# Patient Record
Sex: Female | Born: 1995
Health system: Southern US, Community
[De-identification: ages and names within clinical notes are randomized; demographics above are authoritative.]

## PROBLEM LIST (undated history)

## (undated) DIAGNOSIS — E119 Type 2 diabetes mellitus without complications: Secondary | ICD-10-CM

## (undated) DIAGNOSIS — R011 Cardiac murmur, unspecified: Secondary | ICD-10-CM

## (undated) DIAGNOSIS — F419 Anxiety disorder, unspecified: Secondary | ICD-10-CM

## (undated) HISTORY — DX: Anxiety disorder, unspecified: F41.9

## (undated) HISTORY — DX: Type 2 diabetes mellitus without complications: E11.9

---
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
## 2017-04-14 ENCOUNTER — Ambulatory Visit (INDEPENDENT_AMBULATORY_CARE_PROVIDER_SITE_OTHER): Payer: BLUE CROSS/BLUE SHIELD | Admitting: Family Medicine

## 2017-04-14 ENCOUNTER — Encounter: Payer: Self-pay | Admitting: Family Medicine

## 2017-04-14 VITALS — BP 120/70 | HR 80 | Ht 72.0 in | Wt 241.0 lb

## 2017-04-14 DIAGNOSIS — R55 Syncope and collapse: Secondary | ICD-10-CM | POA: Diagnosis not present

## 2017-04-14 DIAGNOSIS — E069 Thyroiditis, unspecified: Secondary | ICD-10-CM | POA: Insufficient documentation

## 2017-04-14 DIAGNOSIS — E063 Autoimmune thyroiditis: Secondary | ICD-10-CM | POA: Diagnosis not present

## 2017-04-14 DIAGNOSIS — R000 Tachycardia, unspecified: Secondary | ICD-10-CM | POA: Insufficient documentation

## 2017-04-14 DIAGNOSIS — J40 Bronchitis, not specified as acute or chronic: Secondary | ICD-10-CM | POA: Insufficient documentation

## 2017-04-14 LAB — POCT CBG (FASTING - GLUCOSE)-MANUAL ENTRY: Glucose Fasting, POC: 116 mg/dL — AB (ref 70–99)

## 2017-04-14 MED ORDER — AMOXICILLIN-POT CLAVULANATE 875-125 MG PO TABS: 1.0000 | ORAL_TABLET | Freq: Two times a day (BID) | ORAL | 0 refills | Status: DC

## 2017-04-14 NOTE — Progress Notes (Signed)
Name: Ruth Garner   MRN: [id number]    DOB: January 17, 1996   Date:04/14/2017       Progress Note  Subjective  Chief Complaint  Chief Complaint  Patient presents with  . Near Syncope    Near Syncope  This is a new (began october) problem. The current episode started more than 1 month ago (last episode). The problem occurs daily. The problem has been waxing and waning. Associated symptoms include congestion, coughing and nausea. Pertinent negatives include no abdominal pain, anorexia, arthralgias, change in bowel habit, chest pain, chills, diaphoresis, fatigue, fever, headaches, joint swelling, myalgias, neck pain, numbness, rash, sore throat, swollen glands, urinary symptoms, vertigo, visual change, vomiting or weakness. Associated symptoms comments: palpatation "gets really fast". Nothing aggravates the symptoms. She has tried nothing for the symptoms. The treatment provided moderate relief.  Cough  This is a new problem. The current episode started in the past 7 days. The problem has been gradually worsening. The cough is productive of purulent sputum (yellow/green). Pertinent negatives include no chest pain, chills, ear congestion, ear pain, fever, headaches, heartburn, hemoptysis, myalgias, nasal congestion, postnasal drip, rash, rhinorrhea, sore throat, shortness of breath, sweats, weight loss or wheezing. The symptoms are aggravated by pollens. Her past medical history is significant for bronchitis. There is no history of environmental allergies.  Thyroid Problem  Presents for follow-up visit. Symptoms include cold intolerance, hoarse voice, palpitations and weight gain. Patient reports no anxiety, constipation, depressed mood, diaphoresis, diarrhea, fatigue, hair loss, heat intolerance, leg swelling, menstrual problem, nail problem, tremors, visual change or weight loss.    No problem-specific Assessment & Plan notes found for this encounter.   Past Medical History:  Diagnosis Date  .  Anxiety     No past surgical history on file.  Family History  Problem Relation Age of Onset  . Diabetes Mother   . Hypertension Father     Social History   Social History  . Marital status: Single    Spouse name: N/A  . Number of children: N/A  . Years of education: N/A   Occupational History  . Not on file.   Social History Main Topics  . Smoking status: Current Every Day Smoker    Packs/day: 0.50    Types: Cigarettes  . Smokeless tobacco: Never Used  . Alcohol use No  . Drug use: No  . Sexual activity: No   Other Topics Concern  . Not on file   Social History Narrative  . No narrative on file    No Known Allergies  Outpatient Medications Prior to Visit  Medication Sig Dispense Refill  . metoprolol succinate (TOPROL-XL) 25 MG 24 hr tablet Take 1 tablet (25 mg total) by mouth daily. 30 tablet 1  . amoxicillin-clavulanate (AUGMENTIN) 875-125 MG tablet Take 1 tablet by mouth 2 (two) times daily. 20 tablet 0  . busPIRone (BUSPAR) 7.5 MG tablet Take 1 tablet (7.5 mg total) by mouth 2 (two) times daily. 60 tablet 2  . HYDROcodone-acetaminophen (NORCO) 5-325 MG tablet Take 1 tablet by mouth every 6 (six) hours as needed for moderate pain. 10 tablet 0  . ondansetron (ZOFRAN) 4 MG tablet Take 1 tablet (4 mg total) by mouth every 8 (eight) hours as needed for nausea or vomiting. 21 tablet 0  . triamcinolone cream (KENALOG) 0.1 % Apply 1 application topically 2 (two) times daily. 30 g 2   No facility-administered medications prior to visit.     Review of Systems  Constitutional: Positive  for weight gain. Negative for chills, diaphoresis, fatigue, fever, malaise/fatigue and weight loss.  HENT: Positive for congestion and hoarse voice. Negative for ear discharge, ear pain, postnasal drip, rhinorrhea and sore throat.   Eyes: Negative for blurred vision.  Respiratory: Positive for cough. Negative for hemoptysis, sputum production, shortness of breath and wheezing.    Cardiovascular: Positive for palpitations and near-syncope. Negative for chest pain and leg swelling.  Gastrointestinal: Positive for nausea. Negative for abdominal pain, anorexia, blood in stool, change in bowel habit, constipation, diarrhea, heartburn, melena and vomiting.  Genitourinary: Negative for dysuria, frequency, hematuria, menstrual problem and urgency.  Musculoskeletal: Negative for arthralgias, back pain, joint pain, joint swelling, myalgias and neck pain.  Skin: Negative for rash.  Neurological: Negative for dizziness, vertigo, tingling, tremors, sensory change, focal weakness, weakness, numbness and headaches.  Endo/Heme/Allergies: Positive for cold intolerance. Negative for environmental allergies, heat intolerance and polydipsia. Does not bruise/bleed easily.  Psychiatric/Behavioral: Negative for depression and suicidal ideas. The patient is not nervous/anxious and does not have insomnia.      Objective  Vitals:   04/14/17 1336  BP: 120/70  Pulse: 80  Weight: 241 lb (109.3 kg)  Height: 6' (1.829 m)    Physical Exam  Constitutional: She is well-developed, well-nourished, and in no distress. No distress.  HENT:  Head: Normocephalic and atraumatic.  Right Ear: External ear normal.  Left Ear: External ear normal.  Nose: Nose normal.  Mouth/Throat: Oropharynx is clear and moist.  Eyes: Conjunctivae and EOM are normal. Pupils are equal, round, and reactive to light. Right eye exhibits no discharge. Left eye exhibits no discharge.  Neck: Normal range of motion. Neck supple. No JVD present. No thyromegaly present.  Cardiovascular: Normal rate, regular rhythm, normal heart sounds and intact distal pulses.  Exam reveals no gallop and no friction rub.   No murmur heard. Pulmonary/Chest: Effort normal and breath sounds normal. She has no wheezes. She has no rales.  Abdominal: Soft. Bowel sounds are normal. She exhibits no mass. There is no tenderness. There is no guarding.   Musculoskeletal: Normal range of motion. She exhibits no edema.  Lymphadenopathy:    She has no cervical adenopathy.  Neurological: She is alert. She has normal reflexes.  Skin: Skin is warm and dry. She is not diaphoretic.  Psychiatric: Mood and affect normal.  Nursing note and vitals reviewed.     Assessment & Plan  Problem List Items Addressed This Visit      Cardiovascular and Mediastinum   Near syncope - Primary   Relevant Orders   EKG 12-Lead (Completed)   Basic metabolic panel   CBC with Differential/Platelet   Thyroid Panel With TSH   Ambulatory referral to Cardiology   POCT CBG (Fasting - Glucose) (Completed)     Respiratory   Bronchitis   Relevant Medications   amoxicillin-clavulanate (AUGMENTIN) 875-125 MG tablet     Endocrine   Autoimmune lymphocytic chronic thyroiditis   Relevant Orders   Thyroid Panel With TSH   POCT CBG (Fasting - Glucose) (Completed)     Other   Tachycardia   Relevant Orders   Thyroid Panel With TSH   Ambulatory referral to Cardiology      Meds ordered this encounter  Medications  . amoxicillin-clavulanate (AUGMENTIN) 875-125 MG tablet    Sig: Take 1 tablet by mouth 2 (two) times daily.    Dispense:  20 tablet    Refill:  0      Dr. Elizabeth Sauer [facility]  [facility]  04/14/17

## 2017-04-15 LAB — BASIC METABOLIC PANEL
BUN/Creatinine Ratio: 17 (ref 9–23)
BUN: 9 mg/dL (ref 6–20)
CALCIUM: 9.5 mg/dL (ref 8.7–10.2)
CO2: 24 mmol/L (ref 18–29)
CREATININE: 0.52 mg/dL — AB (ref 0.57–1.00)
Chloride: 98 mmol/L (ref 96–106)
GFR calc Af Amer: 160 mL/min/{1.73_m2} (ref >59)
GFR calc non Af Amer: 139 mL/min/{1.73_m2} (ref >59)
GLUCOSE: 84 mg/dL (ref 65–99)
Potassium: 4.8 mmol/L (ref 3.5–5.2)
SODIUM: 139 mmol/L (ref 134–144)

## 2017-04-15 LAB — CBC WITH DIFFERENTIAL/PLATELET
Basophils Absolute: 0 10*3/uL (ref 0.0–0.2)
Basos: 0 %
EOS (ABSOLUTE): 0.2 10*3/uL (ref 0.0–0.4)
EOS: 2 %
HEMATOCRIT: 41.9 % (ref 34.0–46.6)
Hemoglobin: 14.1 g/dL (ref 11.1–15.9)
IMMATURE GRANULOCYTES: 0 %
Immature Grans (Abs): 0 10*3/uL (ref 0.0–0.1)
Lymphocytes Absolute: 3.1 10*3/uL (ref 0.7–3.1)
Lymphs: 25 %
MCH: 29.3 pg (ref 26.6–33.0)
MCHC: 33.7 g/dL (ref 31.5–35.7)
MCV: 87 fL (ref 79–97)
MONOS ABS: 0.6 10*3/uL (ref 0.1–0.9)
Monocytes: 5 %
NEUTROS PCT: 68 %
Neutrophils Absolute: 8.3 10*3/uL — ABNORMAL HIGH (ref 1.4–7.0)
PLATELETS: 330 10*3/uL (ref 150–379)
RBC: 4.82 x10E6/uL (ref 3.77–5.28)
RDW: 13.2 % (ref 12.3–15.4)
WBC: 12.2 10*3/uL — AB (ref 3.4–10.8)

## 2017-04-15 LAB — THYROID PANEL WITH TSH
Free Thyroxine Index: 2 (ref 1.2–4.9)
T3 UPTAKE RATIO: 26 % (ref 24–39)
T4 TOTAL: 7.7 ug/dL (ref 4.5–12.0)
TSH: 2.33 u[IU]/mL (ref 0.450–4.500)

## 2017-04-16 DIAGNOSIS — R000 Tachycardia, unspecified: Secondary | ICD-10-CM | POA: Diagnosis not present

## 2017-04-16 DIAGNOSIS — R55 Syncope and collapse: Secondary | ICD-10-CM | POA: Diagnosis not present

## 2017-04-16 DIAGNOSIS — R011 Cardiac murmur, unspecified: Secondary | ICD-10-CM | POA: Diagnosis not present

## 2017-04-16 DIAGNOSIS — I208 Other forms of angina pectoris: Secondary | ICD-10-CM | POA: Diagnosis not present

## 2017-04-25 DIAGNOSIS — R000 Tachycardia, unspecified: Secondary | ICD-10-CM | POA: Diagnosis not present

## 2017-04-25 DIAGNOSIS — R55 Syncope and collapse: Secondary | ICD-10-CM | POA: Diagnosis not present

## 2017-04-28 DIAGNOSIS — R0602 Shortness of breath: Secondary | ICD-10-CM | POA: Diagnosis not present

## 2017-04-28 DIAGNOSIS — R011 Cardiac murmur, unspecified: Secondary | ICD-10-CM | POA: Diagnosis not present

## 2017-04-28 DIAGNOSIS — I208 Other forms of angina pectoris: Secondary | ICD-10-CM | POA: Diagnosis not present

## 2017-04-28 DIAGNOSIS — R000 Tachycardia, unspecified: Secondary | ICD-10-CM | POA: Diagnosis not present

## 2017-04-28 DIAGNOSIS — R55 Syncope and collapse: Secondary | ICD-10-CM | POA: Diagnosis not present

## 2017-07-04 ENCOUNTER — Emergency Department
Admission: EM | Admit: 2017-07-04 | Discharge: 2017-07-04 | Disposition: A | Discharge status: Home / Self Care | Payer: BLUE CROSS/BLUE SHIELD | Attending: Emergency Medicine | Admitting: Emergency Medicine

## 2017-07-04 ENCOUNTER — Emergency Department: Payer: BLUE CROSS/BLUE SHIELD

## 2017-07-04 DIAGNOSIS — R002 Palpitations: Secondary | ICD-10-CM | POA: Diagnosis not present

## 2017-07-04 DIAGNOSIS — F17210 Nicotine dependence, cigarettes, uncomplicated: Secondary | ICD-10-CM | POA: Diagnosis not present

## 2017-07-04 DIAGNOSIS — R0602 Shortness of breath: Secondary | ICD-10-CM | POA: Diagnosis not present

## 2017-07-04 DIAGNOSIS — R079 Chest pain, unspecified: Secondary | ICD-10-CM | POA: Diagnosis not present

## 2017-07-04 HISTORY — DX: Cardiac murmur, unspecified: R01.1

## 2017-07-04 LAB — BASIC METABOLIC PANEL
ANION GAP: 5 (ref 5–15)
BUN: 14 mg/dL (ref 6–20)
CALCIUM: 9.1 mg/dL (ref 8.9–10.3)
CO2: 27 mmol/L (ref 22–32)
Chloride: 105 mmol/L (ref 101–111)
Creatinine, Ser: 0.65 mg/dL (ref 0.44–1.00)
GFR calc Af Amer: >60 mL/min (ref >60)
GLUCOSE: 109 mg/dL — AB (ref 65–99)
POTASSIUM: 4 mmol/L (ref 3.5–5.1)
SODIUM: 137 mmol/L (ref 135–145)

## 2017-07-04 LAB — CBC
HEMATOCRIT: 38.2 % (ref 35.0–47.0)
Hemoglobin: 13.4 g/dL (ref 12.0–16.0)
MCH: 29.8 pg (ref 26.0–34.0)
MCHC: 34.9 g/dL (ref 32.0–36.0)
MCV: 85.4 fL (ref 80.0–100.0)
Platelets: 214 10*3/uL (ref 150–440)
RBC: 4.48 MIL/uL (ref 3.80–5.20)
RDW: 13.2 % (ref 11.5–14.5)
WBC: 8.2 10*3/uL (ref 3.6–11.0)

## 2017-07-04 LAB — TROPONIN I

## 2017-07-04 NOTE — ED Notes (Signed)
Patient transported to X-ray 

## 2017-07-04 NOTE — ED Triage Notes (Addendum)
Pt bib EMS from home w/ c/o CP, palpitations and dizziness. Pt sts she had an episode of same last Friday, did not seek medical care. Pt sts that she was dx w/ heart murmur 4 months ago and placed on metoprolol 50 mg. Pt sts that she has been taking medication as prescribed. Pt A/OX4, resp even and unlabored. MAE. Ambulatory to [facility] w/o issue. NAD

## 2017-07-04 NOTE — ED Provider Notes (Signed)
[facility] Emergency Department Provider Note   ____________________________________________    I have reviewed the triage vital signs and the nursing notes.   HISTORY  Chief Complaint Chest Pain and Palpitations     HPI Ruth Garner is a 21 y.o. female who presents with complaints of palpitations. Patient reports she initially developed palpitations while at home without some mild chest discomfort soon after and felt quite anxious and uncomfortable. [place] this has happened several times in the last 2 weeks and this happened in the past and she actually saw cardiology almost reports she had a 3 day Holter monitor and a normal echocardiogram and her PCP started her on 50 mg of metoprolol. She reports compliance to medications. After resting in ED she feels well and has no physical complaints.   Past Medical History:  Diagnosis Date  . Anxiety   . Heart murmur     Patient Active Problem List   Diagnosis Date Noted  . Thyroiditis 04/14/2017  . Near syncope 04/14/2017  . Tachycardia 04/14/2017  . Bronchitis 04/14/2017  . 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 No pertinent surgical history.  Prior to Admission medications   Medication Sig Start Date End Date Taking? Authorizing Provider  metoprolol succinate (TOPROL-XL) 50 MG 24 hr tablet Take 50 mg by mouth daily. 06/13/17  Yes [provider]  metoprolol succinate (TOPROL-XL) 25 MG 24 hr tablet Take 1 tablet (25 mg total) by mouth daily. Patient not taking: Reported on 07/04/2017 02/09/16   Duanne Limerick, MD     Allergies Patient has no known allergies.  Family History  Problem Relation Age of Onset  . Diabetes Mother   . Hypertension Father     Social History Social History  Substance Use Topics  . Smoking status: Current Every Day Smoker    Packs/day: 0.50    Types: Cigarettes  . Smokeless tobacco: Never Used  . Alcohol use No      Review of Systems  Constitutional: No fever/chills Eyes: No visual changes.  ENT: No sore throat. Cardiovascular: As above Respiratory: Denies shortness of breath. Gastrointestinal: No abdominal pain.  No nausea, no vomiting.   Genitourinary: Negative for dysuria. Musculoskeletal: Negative for back pain. Skin: Negative for rash. Neurological: Negative for headaches    ____________________________________________   PHYSICAL EXAM:  VITAL SIGNS: ED Triage Vitals  Enc Vitals Group     BP 07/04/17 1204 130/75     Pulse Rate 07/04/17 1204 71     Resp 07/04/17 1204 15     Temp 07/04/17 1222 98.3 F (36.8 C)     Temp Source 07/04/17 1222 Oral     SpO2 07/04/17 1204 99 %     Weight 07/04/17 1207 104.3 kg (230 lb)     Height 07/04/17 1207 1.829 m (6')     Head Circumference --      Peak Flow --      Pain Score 07/04/17 1207 6     Pain Loc --      Pain Edu? --      Excl. in GC? --     Constitutional: Alert and oriented. No acute distress. Pleasant and interactive Eyes: Conjunctivae are normal.   Nose: No congestion/rhinnorhea. Mouth/Throat: Mucous membranes are moist.    Cardiovascular: Normal rate, regular rhythm. Grossly normal heart sounds.  Good peripheral circulation. Respiratory: Normal respiratory effort.  No retractions.  Gastrointestinal: Soft and nontender. No distention.  No CVA tenderness.  Genitourinary: deferred Musculoskeletal: No lower extremity tenderness nor edema.  Warm and well perfused Neurologic:  Normal speech and language. No gross focal neurologic deficits are appreciated.  Skin:  Skin is warm, dry and intact. No rash noted. Psychiatric: Mood and affect are normal. Speech and behavior are normal.  ____________________________________________   LABS (all labs ordered are listed, but only abnormal results are displayed)  Labs Reviewed  BASIC METABOLIC PANEL - Abnormal; Notable for the following:       Result Value   Glucose, Bld 109 (*)     All other components within normal limits  CBC  TROPONIN I   ____________________________________________  EKG  ED ECG REPORT I, Jene EveryKINNER, Haydin Dunn, the attending physician, personally viewed and interpreted this ECG.  Date: 07/04/2017  Rhythm: normal sinus rhythm QRS Axis: normal Intervals: normal ST/T Wave abnormalities: normal Narrative Interpretation: unremarkable  ____________________________________________  RADIOLOGY  Chest x-ray unremarkable ____________________________________________   PROCEDURES  Procedure(s) performed: No    Critical Care performed: No ____________________________________________   INITIAL IMPRESSION / ASSESSMENT AND PLAN / ED COURSE  Pertinent labs & imaging results that were available during my care of the patient were reviewed by me and considered in my medical decision making (see chart for details).  Patient presents after an episode of palpitations, she has a history of the same. She is currently symptom-free in the emergency department. Vital signs are normal. Lab work is unremarkable. She is well-appearing and in no acute distress. EKG is benign. Chest x-ray is normal. She has followed up with cardiology but has not seen him in several months  I have suggested follow-up with cardiology as an outpatient for further evaluation    ____________________________________________   FINAL CLINICAL IMPRESSION(S) / ED DIAGNOSES  Final diagnoses:  Palpitations      NEW MEDICATIONS STARTED DURING THIS VISIT:  Discharge Medication List as of 07/04/2017  1:25 PM       Note:  This document was prepared using Dragon voice recognition software and may include unintentional dictation errors.    Jene EveryKinner, Jhoan Schmieder, MD 07/04/17 1400

## 2017-07-04 NOTE — ED Notes (Signed)
MD Kinner at bedside  

## 2017-07-10 ENCOUNTER — Ambulatory Visit: Payer: BLUE CROSS/BLUE SHIELD | Admitting: Family Medicine

## 2017-07-11 ENCOUNTER — Ambulatory Visit: Payer: BLUE CROSS/BLUE SHIELD | Admitting: Family Medicine

## 2017-07-17 ENCOUNTER — Ambulatory Visit: Payer: BLUE CROSS/BLUE SHIELD | Admitting: Family Medicine

## 2018-01-16 ENCOUNTER — Encounter: Payer: Self-pay | Admitting: Family Medicine

## 2018-01-16 ENCOUNTER — Ambulatory Visit (INDEPENDENT_AMBULATORY_CARE_PROVIDER_SITE_OTHER): Payer: BLUE CROSS/BLUE SHIELD | Admitting: Family Medicine

## 2018-01-16 VITALS — BP 120/70 | HR 60 | Ht 72.0 in | Wt 266.0 lb

## 2018-01-16 DIAGNOSIS — E069 Thyroiditis, unspecified: Secondary | ICD-10-CM

## 2018-01-16 DIAGNOSIS — K922 Gastrointestinal hemorrhage, unspecified: Secondary | ICD-10-CM

## 2018-01-16 DIAGNOSIS — K226 Gastro-esophageal laceration-hemorrhage syndrome: Secondary | ICD-10-CM | POA: Diagnosis not present

## 2018-01-16 DIAGNOSIS — R112 Nausea with vomiting, unspecified: Secondary | ICD-10-CM

## 2018-01-16 DIAGNOSIS — F321 Major depressive disorder, single episode, moderate: Secondary | ICD-10-CM | POA: Diagnosis not present

## 2018-01-16 MED ORDER — PANTOPRAZOLE SODIUM 40 MG PO TBEC: 40.0000 mg | DELAYED_RELEASE_TABLET | Freq: Every day | ORAL | 3 refills | Status: DC

## 2018-01-16 MED ORDER — SERTRALINE HCL 25 MG PO TABS: 25.0000 mg | ORAL_TABLET | Freq: Every day | ORAL | 0 refills | Status: DC

## 2018-01-16 MED ORDER — ONDANSETRON HCL 4 MG PO TABS: 4.0000 mg | ORAL_TABLET | Freq: Three times a day (TID) | ORAL | 0 refills | Status: DC | PRN

## 2018-01-16 MED ORDER — SUCRALFATE 1 G PO TABS: 1.0000 g | ORAL_TABLET | Freq: Three times a day (TID) | ORAL | 0 refills | Status: DC

## 2018-01-16 NOTE — Progress Notes (Signed)
Name: Ruth Garner   MRN: [id number]    DOB: 10-25-1996   Date:01/16/2018       Progress Note  Subjective  Chief Complaint  Chief Complaint  Patient presents with  . Emesis    nauseated, last night had episode of vomitting and had blood in it the circumference of a softball. Also having diarrhea  . Depression    Emesis   This is a new (hematemesis) problem. Episode onset: last night. The problem occurs 2 to 4 times per day. The problem has been unchanged. The emesis has an appearance of bright red blood. There has been no fever. Associated symptoms include abdominal pain. Pertinent negatives include no arthralgias, chest pain, chills, coughing, diarrhea, dizziness, fever, headaches, myalgias, sweats, URI or weight loss. Risk factors include ill contacts (daycare). The treatment provided mild relief.  Depression         This is a new problem.  The current episode started more than 1 month ago.   The problem has been waxing and waning since onset.  Associated symptoms include decreased concentration, helplessness, hopelessness, irritable, decreased interest, appetite change and sad.  Associated symptoms include no fatigue, does not have insomnia, no restlessness, no body aches, no myalgias, no headaches, no indigestion and no suicidal ideas.( suicicidal thoughts some times)   No problem-specific Assessment & Plan notes found for this encounter.   Past Medical History:  Diagnosis Date  . Anxiety   . Heart murmur     No past surgical history on file.  Family History  Problem Relation Age of Onset  . Diabetes Mother   . Hypertension Father     Social History   Socioeconomic History  . Marital status: Single    Spouse name: Not on file  . Number of children: Not on file  . Years of education: Not on file  . Highest education level: Not on file  Social Needs  . Financial resource strain: Not on file  . Food insecurity - worry: Not on file  . Food insecurity - inability: Not  on file  . Transportation needs - medical: Not on file  . Transportation needs - non-medical: Not on file  Occupational History  . Not on file  Tobacco Use  . Smoking status: Former Smoker    Packs/day: 0.50    Types: E-cigarettes    Last attempt to quit: 10/16/2017    Years since quitting: 0.2  . Smokeless tobacco: Never Used  Substance and Sexual Activity  . Alcohol use: No    Alcohol/week: 0.0 oz  . Drug use: No  . Sexual activity: No  Other Topics Concern  . Not on file  Social History Narrative  . Not on file    No Known Allergies  Outpatient Medications Prior to Visit  Medication Sig Dispense Refill  . metoprolol succinate (TOPROL-XL) 25 MG 24 hr tablet Take 1 tablet (25 mg total) by mouth daily. (Patient not taking: Reported on 07/04/2017) 30 tablet 1  . metoprolol succinate (TOPROL-XL) 50 MG 24 hr tablet Take 50 mg by mouth daily.  5   No facility-administered medications prior to visit.     Review of Systems  Constitutional: Positive for appetite change. Negative for chills, fatigue, fever, malaise/fatigue and weight loss.  HENT: Negative for ear discharge, ear pain and sore throat.   Eyes: Negative for blurred vision.  Respiratory: Negative for cough, sputum production, shortness of breath and wheezing.   Cardiovascular: Negative for chest pain, palpitations and leg  swelling.  Gastrointestinal: Positive for abdominal pain and vomiting. Negative for blood in stool, constipation, diarrhea, heartburn, melena and nausea.  Genitourinary: Negative for dysuria, frequency, hematuria and urgency.  Musculoskeletal: Negative for arthralgias, back pain, joint pain, myalgias and neck pain.  Skin: Negative for rash.  Neurological: Negative for dizziness, tingling, sensory change, focal weakness and headaches.  Endo/Heme/Allergies: Negative for environmental allergies and polydipsia. Does not bruise/bleed easily.  Psychiatric/Behavioral: Positive for decreased concentration and  depression. Negative for suicidal ideas. The patient is not nervous/anxious and does not have insomnia.      Objective  Vitals:   01/16/18 1042  BP: 120/70  Pulse: 60  Weight: 266 lb (120.7 kg)  Height: 6' (1.829 m)    Physical Exam  Constitutional: She is well-developed, well-nourished, and in no distress. She is irritable. No distress.  HENT:  Head: Normocephalic and atraumatic.  Right Ear: External ear normal.  Left Ear: External ear normal.  Nose: Nose normal.  Mouth/Throat: Oropharynx is clear and moist.  Eyes: Conjunctivae and EOM are normal. Pupils are equal, round, and reactive to light. Right eye exhibits no discharge. Left eye exhibits no discharge.  Neck: Normal range of motion. Neck supple. No JVD present. No thyromegaly present.  Cardiovascular: Normal rate, regular rhythm, normal heart sounds and intact distal pulses. Exam reveals no gallop and no friction rub.  No murmur heard. Pulmonary/Chest: Effort normal and breath sounds normal. She has no wheezes. She has no rales.  Abdominal: Soft. Bowel sounds are normal. She exhibits no mass. There is no tenderness. There is no guarding.  Musculoskeletal: Normal range of motion. She exhibits no edema.  Lymphadenopathy:    She has no cervical adenopathy.  Neurological: She is alert. She has normal reflexes.  Skin: Skin is warm and dry. She is not diaphoretic.  Psychiatric: Mood and affect normal.  Nursing note and vitals reviewed.     Assessment & Plan  Problem List Items Addressed This Visit      Endocrine   Thyroiditis   Relevant Orders   Ambulatory referral to Endocrinology    Other Visit Diagnoses    Non-intractable vomiting with nausea, unspecified vomiting type    -  Primary   Relevant Medications   pantoprazole (PROTONIX) 40 MG tablet   sucralfate (CARAFATE) 1 g tablet   Current moderate episode of major depressive disorder, unspecified whether recurrent (HCC)       Relevant Medications   sertraline  (ZOLOFT) 25 MG tablet   Other Relevant Orders   Ambulatory referral to Psychiatry   Mallory-Weiss syndrome       Relevant Medications   pantoprazole (PROTONIX) 40 MG tablet   sucralfate (CARAFATE) 1 g tablet   Other Relevant Orders   CBC with Differential/Platelet   Upper GI bleed       Relevant Medications   pantoprazole (PROTONIX) 40 MG tablet   sucralfate (CARAFATE) 1 g tablet   Other Relevant Orders   CBC with Differential/Platelet      Meds ordered this encounter  Medications  . pantoprazole (PROTONIX) 40 MG tablet    Sig: Take 1 tablet (40 mg total) by mouth daily.    Dispense:  30 tablet    Refill:  3  . sucralfate (CARAFATE) 1 g tablet    Sig: Take 1 tablet (1 g total) by mouth 4 (four) times daily -  with meals and at bedtime.    Dispense:  30 tablet    Refill:  0  . sertraline (ZOLOFT)  25 MG tablet    Sig: Take 1 tablet (25 mg total) by mouth daily. Start one half a day for 2 weeks    Dispense:  30 tablet    Refill:  0      Dr. Hayden Rasmusseneanna Kieran Arreguin [facility]  01/16/18

## 2018-01-17 LAB — CBC WITH DIFFERENTIAL/PLATELET
BASOS: 0 %
Basophils Absolute: 0 10*3/uL (ref 0.0–0.2)
EOS (ABSOLUTE): 0.2 10*3/uL (ref 0.0–0.4)
EOS: 2 %
HEMATOCRIT: 42 % (ref 34.0–46.6)
Hemoglobin: 14.2 g/dL (ref 11.1–15.9)
IMMATURE GRANS (ABS): 0 10*3/uL (ref 0.0–0.1)
Immature Granulocytes: 0 %
Lymphocytes Absolute: 2.3 10*3/uL (ref 0.7–3.1)
Lymphs: 25 %
MCH: 29.2 pg (ref 26.6–33.0)
MCHC: 33.8 g/dL (ref 31.5–35.7)
MCV: 86 fL (ref 79–97)
MONOS ABS: 0.5 10*3/uL (ref 0.1–0.9)
Monocytes: 6 %
NEUTROS ABS: 6.2 10*3/uL (ref 1.4–7.0)
NEUTROS PCT: 67 %
Platelets: 249 10*3/uL (ref 150–379)
RBC: 4.87 x10E6/uL (ref 3.77–5.28)
RDW: 13.6 % (ref 12.3–15.4)
WBC: 9.3 10*3/uL (ref 3.4–10.8)

## 2018-01-27 ENCOUNTER — Ambulatory Visit (INDEPENDENT_AMBULATORY_CARE_PROVIDER_SITE_OTHER): Payer: BLUE CROSS/BLUE SHIELD | Admitting: Family Medicine

## 2018-01-27 ENCOUNTER — Encounter: Payer: Self-pay | Admitting: Family Medicine

## 2018-01-27 VITALS — BP 120/80 | HR 80 | Ht 70.0 in | Wt 263.0 lb

## 2018-01-27 DIAGNOSIS — N3001 Acute cystitis with hematuria: Secondary | ICD-10-CM

## 2018-01-27 DIAGNOSIS — Z021 Encounter for pre-employment examination: Secondary | ICD-10-CM

## 2018-01-27 DIAGNOSIS — Z111 Encounter for screening for respiratory tuberculosis: Secondary | ICD-10-CM | POA: Diagnosis not present

## 2018-01-27 LAB — POCT URINALYSIS DIPSTICK
BILIRUBIN UA: NEGATIVE
GLUCOSE UA: NEGATIVE
KETONES UA: NEGATIVE
Nitrite, UA: NEGATIVE
Protein, UA: NEGATIVE
Spec Grav, UA: 1.01 (ref 1.010–1.025)
UROBILINOGEN UA: 0.2 U/dL
pH, UA: 5 (ref 5.0–8.0)

## 2018-01-27 MED ORDER — CIPROFLOXACIN HCL 250 MG PO TABS: 250.0000 mg | ORAL_TABLET | Freq: Two times a day (BID) | ORAL | 0 refills | Status: DC

## 2018-01-27 NOTE — Progress Notes (Signed)
Name: Ruth Garner   MRN: [id number]    DOB: Jun 09, 1996   Date:01/27/2018       Progress Note  Subjective  Chief Complaint  Chief Complaint  Patient presents with  . Annual Exam    sheriff's dept PE    Patient presents for physical for law enforcement.    No problem-specific Assessment & Plan notes found for this encounter.   Past Medical History:  Diagnosis Date  . Anxiety   . Heart murmur     History reviewed. No pertinent surgical history.  Family History  Problem Relation Age of Onset  . Diabetes Mother   . Hypertension Father     Social History   Socioeconomic History  . Marital status: Single    Spouse name: Not on file  . Number of children: Not on file  . Years of education: Not on file  . Highest education level: Not on file  Social Needs  . Financial resource strain: Not on file  . Food insecurity - worry: Not on file  . Food insecurity - inability: Not on file  . Transportation needs - medical: Not on file  . Transportation needs - non-medical: Not on file  Occupational History  . Not on file  Tobacco Use  . Smoking status: Former Smoker    Packs/day: 0.50    Types: E-cigarettes    Last attempt to quit: 10/16/2017    Years since quitting: 0.2  . Smokeless tobacco: Never Used  Substance and Sexual Activity  . Alcohol use: No    Alcohol/week: 0.0 oz  . Drug use: No  . Sexual activity: No  Other Topics Concern  . Not on file  Social History Narrative  . Not on file    No Known Allergies  Outpatient Medications Prior to Visit  Medication Sig Dispense Refill  . pantoprazole (PROTONIX) 40 MG tablet Take 1 tablet (40 mg total) by mouth daily. 30 tablet 3  . sertraline (ZOLOFT) 25 MG tablet Take 1 tablet (25 mg total) by mouth daily. Start one half a day for 2 weeks 30 tablet 0  . metoprolol succinate (TOPROL-XL) 25 MG 24 hr tablet Take 1 tablet (25 mg total) by mouth daily. (Patient not taking: Reported on 01/27/2018) 30 tablet 1  .  ondansetron (ZOFRAN) 4 MG tablet Take 1 tablet (4 mg total) by mouth every 8 (eight) hours as needed for nausea or vomiting. 20 tablet 0  . sucralfate (CARAFATE) 1 g tablet Take 1 tablet (1 g total) by mouth 4 (four) times daily -  with meals and at bedtime. 30 tablet 0   No facility-administered medications prior to visit.     Review of Systems  Constitutional: Negative for chills, fever, malaise/fatigue and weight loss.  HENT: Negative for ear discharge, ear pain and sore throat.   Eyes: Negative for blurred vision.  Respiratory: Negative for cough, sputum production, shortness of breath and wheezing.   Cardiovascular: Negative for chest pain, palpitations and leg swelling.  Gastrointestinal: Negative for abdominal pain, blood in stool, constipation, diarrhea, heartburn, melena and nausea.  Genitourinary: Negative for dysuria, frequency, hematuria and urgency.  Musculoskeletal: Negative for back pain, joint pain, myalgias and neck pain.  Skin: Negative for rash.  Neurological: Negative for dizziness, tingling, sensory change, focal weakness and headaches.  Endo/Heme/Allergies: Negative for environmental allergies and polydipsia. Does not bruise/bleed easily.  Psychiatric/Behavioral: Negative for depression and suicidal ideas. The patient is not nervous/anxious and does not have insomnia.  Objective  Vitals:   01/27/18 0853  BP: 120/80  Pulse: 80  Weight: 263 lb (119.3 kg)  Height: 5\' 10"  (1.778 m)    Physical Exam  Constitutional: She is well-developed, well-nourished, and in no distress. No distress.  HENT:  Head: Normocephalic and atraumatic.  Right Ear: Tympanic membrane, external ear and ear canal normal.  Left Ear: Tympanic membrane, external ear and ear canal normal.  Nose: Nose normal. No mucosal edema.  Mouth/Throat: Uvula is midline, oropharynx is clear and moist and mucous membranes are normal. No oropharyngeal exudate, posterior oropharyngeal edema or posterior  oropharyngeal erythema.  Eyes: Conjunctivae, EOM and lids are normal. Pupils are equal, round, and reactive to light. Right eye exhibits no discharge. Left eye exhibits no discharge.  Fundoscopic exam:      The right eye shows no arteriolar narrowing.       The left eye shows no arteriolar narrowing.  Neck: Trachea normal and normal range of motion. Neck supple. Normal carotid pulses, no hepatojugular reflux and no JVD present. Carotid bruit is not present. Thyromegaly present. No thyroid mass present.  Cardiovascular: Normal rate, regular rhythm, S1 normal, S2 normal, normal heart sounds and intact distal pulses. Exam reveals no gallop, no S3, no S4 and no friction rub.  No murmur heard. Pulmonary/Chest: Effort normal and breath sounds normal. No respiratory distress. She has no wheezes. She has no rales. She exhibits no tenderness.  Abdominal: Soft. Bowel sounds are normal. She exhibits no mass. There is no hepatosplenomegaly. There is no tenderness. There is no guarding and no CVA tenderness.  Musculoskeletal: Normal range of motion. She exhibits no edema.       Cervical back: Normal.       Thoracic back: Normal.       Lumbar back: Normal.  Lymphadenopathy:       Head (right side): No submandibular adenopathy present.       Head (left side): No submandibular adenopathy present.    She has no cervical adenopathy.  Neurological: She is alert. She has normal sensation, normal strength, normal reflexes and intact cranial nerves.  Skin: Skin is warm, dry and intact. She is not diaphoretic. No pallor.  Psychiatric: Mood and affect normal.  Nursing note and vitals reviewed.     Assessment & Plan  Problem List Items Addressed This Visit    None    Visit Diagnoses    Physical exam, pre-employment    -  Primary   sheriff's dept   Relevant Orders   POCT urinalysis dipstick (Completed)   PPD (Completed)   Acute cystitis with hematuria       Relevant Medications   ciprofloxacin (CIPRO)  250 MG tablet   Other Relevant Orders   POCT urinalysis dipstick (Completed)   Screening-pulmonary TB       Relevant Orders   PPD (Completed)      Meds ordered this encounter  Medications  . ciprofloxacin (CIPRO) 250 MG tablet    Sig: Take 1 tablet (250 mg total) by mouth 2 (two) times daily.    Dispense:  6 tablet    Refill:  0      Dr. Elizabeth Sauereanna Mia [facility]  01/27/18

## 2018-01-29 ENCOUNTER — Other Ambulatory Visit: Payer: Self-pay

## 2018-01-29 LAB — TB SKIN TEST
INDURATION: 0 mm
TB SKIN TEST: NEGATIVE

## 2018-02-12 ENCOUNTER — Other Ambulatory Visit: Payer: Self-pay | Admitting: Family Medicine

## 2018-02-12 DIAGNOSIS — F321 Major depressive disorder, single episode, moderate: Secondary | ICD-10-CM

## 2018-03-04 ENCOUNTER — Encounter: Payer: Self-pay | Admitting: Family Medicine

## 2018-03-04 ENCOUNTER — Ambulatory Visit (INDEPENDENT_AMBULATORY_CARE_PROVIDER_SITE_OTHER): Payer: BLUE CROSS/BLUE SHIELD | Admitting: Family Medicine

## 2018-03-04 VITALS — BP 124/86 | HR 84 | Temp 98.1°F | Ht 70.0 in | Wt 260.0 lb

## 2018-03-04 DIAGNOSIS — F320 Major depressive disorder, single episode, mild: Secondary | ICD-10-CM | POA: Diagnosis not present

## 2018-03-04 DIAGNOSIS — J40 Bronchitis, not specified as acute or chronic: Secondary | ICD-10-CM | POA: Diagnosis not present

## 2018-03-04 MED ORDER — AMOXICILLIN-POT CLAVULANATE 875-125 MG PO TABS: 1.0000 | ORAL_TABLET | Freq: Two times a day (BID) | ORAL | 0 refills | Status: DC

## 2018-03-04 MED ORDER — GUAIFENESIN-CODEINE 100-10 MG/5ML PO SYRP: 5.0000 mL | ORAL_SOLUTION | Freq: Three times a day (TID) | ORAL | 0 refills | Status: DC | PRN

## 2018-03-04 NOTE — Progress Notes (Signed)
Name: Ruth Garner   MRN: [id number]    DOB: 1996/06/24   Date:03/04/2018       Progress Note  Subjective  Chief Complaint  Chief Complaint  Patient presents with  . Cough    not able to get anything up until coughing to the point of throwing up x 2 weeks    Cough  This is a new problem. The current episode started 1 to 4 weeks ago (2 weeks). The problem has been gradually worsening. The problem occurs every few minutes. The cough is productive of purulent sputum. Associated symptoms include chest pain, heartburn, nasal congestion, postnasal drip, rhinorrhea, shortness of breath and wheezing. Pertinent negatives include no chills, ear congestion, ear pain, fever, headaches, hemoptysis, myalgias, rash, sore throat, sweats or weight loss. The symptoms are aggravated by pollens and cold air. She has tried OTC cough suppressant for the symptoms. The treatment provided moderate relief. There is no history of asthma, bronchiectasis, bronchitis, COPD, emphysema, environmental allergies or pneumonia.  Insomnia  Primary symptoms: fragmented sleep, premature morning awakening, no malaise/fatigue.  The current episode started more than one month. The problem occurs nightly. The problem has been waxing and waning since onset. The symptoms are aggravated by SSRI use. The treatment provided moderate relief. PMH includes: no depression. Prior diagnostic workup includes:  No prior workup.    No problem-specific Assessment & Plan notes found for this encounter.   Past Medical History:  Diagnosis Date  . Anxiety   . Heart murmur     History reviewed. No pertinent surgical history.  Family History  Problem Relation Age of Onset  . Diabetes Mother   . Hypertension Father     Social History   Socioeconomic History  . Marital status: Single    Spouse name: Not on file  . Number of children: Not on file  . Years of education: Not on file  . Highest education level: Not on file  Social Needs   . Financial resource strain: Not on file  . Food insecurity - worry: Not on file  . Food insecurity - inability: Not on file  . Transportation needs - medical: Not on file  . Transportation needs - non-medical: Not on file  Occupational History  . Not on file  Tobacco Use  . Smoking status: Former Smoker    Packs/day: 0.50    Types: E-cigarettes    Last attempt to quit: 10/16/2017    Years since quitting: 0.3  . Smokeless tobacco: Never Used  Substance and Sexual Activity  . Alcohol use: No    Alcohol/week: 0.0 oz  . Drug use: No  . Sexual activity: No  Other Topics Concern  . Not on file  Social History Narrative  . Not on file    No Known Allergies  Outpatient Medications Prior to Visit  Medication Sig Dispense Refill  . sertraline (ZOLOFT) 25 MG tablet TAKE 1 TABLET (25 MG TOTAL) BY MOUTH DAILY. START ONE HALF A DAY FOR 2 WEEKS 30 tablet 1  . metoprolol succinate (TOPROL-XL) 25 MG 24 hr tablet Take 1 tablet (25 mg total) by mouth daily. (Patient not taking: Reported on 01/27/2018) 30 tablet 1  . pantoprazole (PROTONIX) 40 MG tablet Take 1 tablet (40 mg total) by mouth daily. (Patient not taking: Reported on 03/04/2018) 30 tablet 3  . ciprofloxacin (CIPRO) 250 MG tablet Take 1 tablet (250 mg total) by mouth 2 (two) times daily. 6 tablet 0   No facility-administered medications prior to  visit.     Review of Systems  Constitutional: Negative for chills, fever, malaise/fatigue and weight loss.  HENT: Positive for postnasal drip and rhinorrhea. Negative for ear discharge, ear pain and sore throat.   Eyes: Negative for blurred vision.  Respiratory: Positive for cough, shortness of breath and wheezing. Negative for hemoptysis and sputum production.   Cardiovascular: Positive for chest pain. Negative for palpitations and leg swelling.  Gastrointestinal: Positive for heartburn. Negative for abdominal pain, blood in stool, constipation, diarrhea, melena and nausea.  Genitourinary:  Negative for dysuria, frequency, hematuria and urgency.  Musculoskeletal: Negative for back pain, joint pain, myalgias and neck pain.  Skin: Negative for rash.  Neurological: Negative for dizziness, tingling, sensory change, focal weakness and headaches.  Endo/Heme/Allergies: Negative for environmental allergies and polydipsia. Does not bruise/bleed easily.  Psychiatric/Behavioral: Negative for depression and suicidal ideas. The patient is not nervous/anxious and does not have insomnia.      Objective  Vitals:   03/04/18 1039  BP: 124/86  Pulse: 84  Temp: 98.1 F (36.7 C)  TempSrc: Oral  Weight: 260 lb (117.9 kg)  Height: 5\' 10"  (1.778 m)    Physical Exam  Constitutional: She is well-developed, well-nourished, and in no distress. No distress.  HENT:  Head: Normocephalic and atraumatic.  Right Ear: External ear normal.  Left Ear: External ear normal.  Nose: Nose normal.  Mouth/Throat: Oropharynx is clear and moist.  Eyes: Conjunctivae and EOM are normal. Pupils are equal, round, and reactive to light. Right eye exhibits no discharge. Left eye exhibits no discharge.  Neck: Normal range of motion. Neck supple. No JVD present. No thyromegaly present.  Cardiovascular: Normal rate, regular rhythm, normal heart sounds and intact distal pulses. Exam reveals no gallop and no friction rub.  No murmur heard. Pulmonary/Chest: Effort normal and breath sounds normal. She has no wheezes. She has no rales.  Abdominal: Soft. Bowel sounds are normal. She exhibits no mass. There is no tenderness. There is no guarding.  Musculoskeletal: Normal range of motion. She exhibits no edema.  Lymphadenopathy:    She has no cervical adenopathy.  Neurological: She is alert. She has normal reflexes.  Skin: Skin is warm and dry. She is not diaphoretic.  Psychiatric: Mood and affect normal.  Nursing note and vitals reviewed.     Assessment & Plan  Problem List Items Addressed This Visit       Respiratory   Bronchitis - Primary   Relevant Medications   amoxicillin-clavulanate (AUGMENTIN) 875-125 MG tablet   guaiFENesin-codeine (ROBITUSSIN AC) 100-10 MG/5ML syrup    Other Visit Diagnoses    Current mild episode of major depressive disorder, unspecified whether recurrent (HCC)       Relevant Orders   Ambulatory referral to Psychiatry      Meds ordered this encounter  Medications  . amoxicillin-clavulanate (AUGMENTIN) 875-125 MG tablet    Sig: Take 1 tablet by mouth 2 (two) times daily.    Dispense:  20 tablet    Refill:  0  . guaiFENesin-codeine (ROBITUSSIN AC) 100-10 MG/5ML syrup    Sig: Take 5 mLs by mouth 3 (three) times daily as needed for cough.    Dispense:  150 mL    Refill:  0      Dr. Elizabeth Sauer [facility]  03/04/18

## 2018-09-01 ENCOUNTER — Other Ambulatory Visit: Payer: Self-pay

## 2018-09-01 ENCOUNTER — Emergency Department
Admission: EM | Admit: 2018-09-01 | Discharge: 2018-09-01 | Disposition: A | Discharge status: Home / Self Care | Payer: BLUE CROSS/BLUE SHIELD | Attending: Emergency Medicine | Admitting: Emergency Medicine

## 2018-09-01 DIAGNOSIS — R222 Localized swelling, mass and lump, trunk: Secondary | ICD-10-CM | POA: Diagnosis present

## 2018-09-01 DIAGNOSIS — Z87891 Personal history of nicotine dependence: Secondary | ICD-10-CM | POA: Diagnosis not present

## 2018-09-01 DIAGNOSIS — L0501 Pilonidal cyst with abscess: Secondary | ICD-10-CM | POA: Insufficient documentation

## 2018-09-01 MED ORDER — SULFAMETHOXAZOLE-TRIMETHOPRIM 800-160 MG PO TABS
1.0000 | ORAL_TABLET | Freq: Once | ORAL | Status: AC
  Administered 2018-09-01: 1 via ORAL
  Filled 2018-09-01: qty 1

## 2018-09-01 MED ORDER — SULFAMETHOXAZOLE-TRIMETHOPRIM 800-160 MG PO TABS: 1.0000 | ORAL_TABLET | Freq: Two times a day (BID) | ORAL | 0 refills | Status: DC

## 2018-09-01 MED ORDER — TRAMADOL HCL 50 MG PO TABS: 50.0000 mg | ORAL_TABLET | Freq: Two times a day (BID) | ORAL | 0 refills | Status: AC | PRN

## 2018-09-01 NOTE — ED Triage Notes (Signed)
Pt states she had a swollen painful to sacral area x 1 week. No hx of the same states has not drained.

## 2018-09-01 NOTE — ED Provider Notes (Signed)
[facility] Emergency Department Provider Note ____________________________________________  Time seen: 2155  I have reviewed the triage vital signs and the nursing notes.  HISTORY  Chief Complaint  Cyst  HPI Ruth Garner is a 22 y.o. female who presents herself to the ED for evaluation of tenderness to the gluteal cleft. She reports the area has been present and worsening for the last week. She denies any previous history of pilonidal cyst abscess. She has not noted any spontaneous drainage. She reports it is becoming uncomfortable to touch and sit. She denies fevers, chills, or sweats.  Past Medical History:  Diagnosis Date  . Anxiety   . Heart murmur     Patient Active Problem List   Diagnosis Date Noted  . Thyroiditis 04/14/2017  . Near syncope 04/14/2017  . Tachycardia 04/14/2017  . Bronchitis 04/14/2017  . Autoimmune lymphocytic chronic thyroiditis 04/14/2017    No past surgical history on file.  Prior to Admission medications   Medication Sig Start Date End Date Taking? Authorizing Provider  metoprolol succinate (TOPROL-XL) 25 MG 24 hr tablet Take 1 tablet (25 mg total) by mouth daily. Patient not taking: Reported on 01/27/2018 02/09/16   Duanne LimerickJones, Deanna C, MD  sulfamethoxazole-trimethoprim (BACTRIM DS,SEPTRA DS) 800-160 MG tablet Take 1 tablet by mouth 2 (two) times daily. 09/01/18   Treyshaun Keatts, Charlesetta IvoryJenise V Bacon, PA-C  traMADol (ULTRAM) 50 MG tablet Take 1 tablet (50 mg total) by mouth 2 (two) times daily as needed for up to 5 days. 09/01/18 09/06/18  Edit Ricciardelli, Charlesetta IvoryJenise V Bacon, PA-C    Allergies Patient has no known allergies.  Family History  Problem Relation Age of Onset  . Diabetes Mother   . Hypertension Father     Social History Social History   Tobacco Use  . Smoking status: Former Smoker    Packs/day: 0.50    Types: E-cigarettes    Last attempt to quit: 10/16/2017    Years since quitting: 0.8  . Smokeless tobacco: Never Used   Substance Use Topics  . Alcohol use: No    Alcohol/week: 0.0 standard drinks  . Drug use: No    Review of Systems  Constitutional: Negative for fever. Cardiovascular: Negative for chest pain. Respiratory: Negative for shortness of breath. Gastrointestinal: Negative for abdominal pain, vomiting and diarrhea. Genitourinary: Negative for dysuria. Musculoskeletal: Negative for back pain. Skin: Negative for rash. Pilonidal cyst abscess as above.  Neurological: Negative for headaches, focal weakness or numbness. ____________________________________________  PHYSICAL EXAM:  VITAL SIGNS: ED Triage Vitals [09/01/18 2128]  Enc Vitals Group     BP (!) 141/72     Pulse Rate 100     Resp 18     Temp 99.8 F (37.7 C)     Temp Source Oral     SpO2 99 %     Weight 272 lb (123.4 kg)     Height 5\' 10"  (1.778 m)     Head Circumference      Peak Flow      Pain Score 8     Pain Loc      Pain Edu?      Excl. in GC?     Constitutional: Alert and oriented. Well appearing and in no distress. Head: Normocephalic and atraumatic. Cardiovascular: Normal rate, regular rhythm. Normal distal pulses. Respiratory: Normal respiratory effort.  Gastrointestinal: Soft and nontender. No distention. Musculoskeletal: Nontender with normal range of motion in all extremities.  Neurologic:  Normal gait without ataxia. Normal speech and language. No  gross focal neurologic deficits are appreciated. Skin:  Skin is warm, dry and intact. No rash noted. Patient with a small, focal area of erythema and prominence to the top of the gluteal cleft. There is no pointing, fluctuance, or surrounding induration. The area is without central punctum or sinus tract. There is no appreciable superficial abscess noted.  ____________________________________________  PROCEDURES  Procedures Bactrim DS 1 PO ____________________________________________  INITIAL IMPRESSION / ASSESSMENT AND PLAN / ED COURSE  Patient with a ED  evaluation of 1 week complaint of worsening tenderness and redness to the gluteal cleft, consistent with an early pilonidal cyst abscess.  The area is slightly erythematous but there is no appreciable pointing, fluctuance, or central sinus tract to indicate it is amenable to incision and drainage at this time.  Patient will be discharged with a prescription for Bactrim as well as Ultram for pain.  She is encouraged to apply warm compresses and utilize sitz bath to help promote spontaneous drainage or development into a possibly drainable cutaneous abscess.  She will return to the ED as discussed, or follow-up with general surgery for definitive management.  I reviewed the patient's prescription history over the last 12 months in the multi-state controlled substances database(s) that includes [place], [place], [place], [place], [place], [place], [place], [place], [place], [place], [place], [place], [place], and [place].  Results were notable for no current prescriptions.  ____________________________________________  FINAL CLINICAL IMPRESSION(S) / ED DIAGNOSES  Final diagnoses:  Pilonidal cyst with abscess      Lissa Hoard, PA-C 09/01/18 2304    Sharyn Creamer, MD 09/02/18 0001

## 2018-09-01 NOTE — Discharge Instructions (Signed)
You are being treated with an antibiotic for a pilonidal cyst abscess. Take the medicine as directed. Apply warm compresses to promote healing. Follow-up with this department for treatment as discussed. You may also see a general surgeon for definitive management.

## 2018-09-03 ENCOUNTER — Encounter: Payer: Self-pay | Admitting: Emergency Medicine

## 2018-09-03 ENCOUNTER — Emergency Department
Admission: EM | Admit: 2018-09-03 | Discharge: 2018-09-03 | Disposition: A | Discharge status: Home / Self Care | Payer: BLUE CROSS/BLUE SHIELD | Attending: Emergency Medicine | Admitting: Emergency Medicine

## 2018-09-03 ENCOUNTER — Other Ambulatory Visit: Payer: Self-pay

## 2018-09-03 DIAGNOSIS — Z87891 Personal history of nicotine dependence: Secondary | ICD-10-CM | POA: Insufficient documentation

## 2018-09-03 DIAGNOSIS — L0501 Pilonidal cyst with abscess: Secondary | ICD-10-CM | POA: Diagnosis not present

## 2018-09-03 MED ORDER — LIDOCAINE-EPINEPHRINE 1 %-1:100000 IJ SOLN
INTRAMUSCULAR | Status: AC
  Filled 2018-09-03: qty 1

## 2018-09-03 MED ORDER — OXYCODONE-ACETAMINOPHEN 5-325 MG PO TABS
1.0000 | ORAL_TABLET | Freq: Once | ORAL | Status: AC
  Administered 2018-09-03: 1 via ORAL
  Filled 2018-09-03: qty 1

## 2018-09-03 MED ORDER — OXYCODONE-ACETAMINOPHEN 5-325 MG PO TABS: 1.0000 | ORAL_TABLET | Freq: Three times a day (TID) | ORAL | 0 refills | Status: DC | PRN

## 2018-09-03 MED ORDER — DIAZEPAM 5 MG PO TABS
5.0000 mg | ORAL_TABLET | Freq: Once | ORAL | Status: AC
  Administered 2018-09-03: 5 mg via ORAL
  Filled 2018-09-03: qty 1

## 2018-09-03 MED ORDER — LIDOCAINE-EPINEPHRINE 1 %-1:100000 IJ SOLN
10.0000 mL | Freq: Once | INTRAMUSCULAR | Status: DC
Start: 2018-09-03 — End: 2018-09-04

## 2018-09-03 NOTE — ED Triage Notes (Signed)
Patient to ER for c/o pilonidal cyst diagnosed on 9/17. States cyst has gotten bigger and more painful since then.

## 2018-09-03 NOTE — ED Provider Notes (Signed)
[facility] Emergency Department Provider Note       Time seen: ----------------------------------------- 9:09 PM on 09/03/2018 -----------------------------------------   I have reviewed the triage vital signs and the nursing notes.  HISTORY   Chief Complaint Cyst    HPI Ruth Garner is a 22 y.o. female with a history of anxiety, thyroiditis who presents to the ED for pain in her gluteal cleft.  Patient was seen in the ER and diagnosed with a pilonidal cyst 2 days ago.  She was started on antibiotics but reports is gotten bigger and has become more painful.  She has difficulty sitting due to the pain.  She denies fevers or chills and has been taking medication as prescribed.  Past Medical History:  Diagnosis Date  . Anxiety   . Heart murmur     Patient Active Problem List   Diagnosis Date Noted  . Thyroiditis 04/14/2017  . Near syncope 04/14/2017  . Tachycardia 04/14/2017  . Bronchitis 04/14/2017  . 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 No pertinent surgical history.  Allergies Patient has no known allergies.  Social History Social History   Tobacco Use  . Smoking status: Former Smoker    Packs/day: 0.50    Types: E-cigarettes    Last attempt to quit: 10/16/2017    Years since quitting: 0.8  . Smokeless tobacco: Never Used  Substance Use Topics  . Alcohol use: No    Alcohol/week: 0.0 standard drinks  . Drug use: No   Review of Systems Constitutional: Negative for fever. Musculoskeletal: Positive for buttock pain Skin: Positive for swelling in her gluteal cleft Neurological: Negative for headaches, focal weakness or numbness.  All systems negative/normal/unremarkable except as stated in the HPI  ____________________________________________   PHYSICAL EXAM:  VITAL SIGNS: ED Triage Vitals  Enc Vitals Group     BP 09/03/18 2023 117/81     Pulse Rate 09/03/18 2023 (!) 113     Resp  09/03/18 2023 20     Temp 09/03/18 2023 98.7 F (37.1 C)     Temp Source 09/03/18 2023 Oral     SpO2 09/03/18 2023 99 %     Weight 09/03/18 2025 272 lb 0.8 oz (123.4 kg)     Height 09/03/18 2025 5\' 10"  (1.778 m)     Head Circumference --      Peak Flow --      Pain Score 09/03/18 2024 8     Pain Loc --      Pain Edu? --      Excl. in GC? --    Constitutional: Alert and oriented. Well appearing and in no distress. Musculoskeletal: Obvious pilonidal cyst at the apex of her gluteal cleft with induration and erythema Neurologic:  Normal speech and language. No gross focal neurologic deficits are appreciated.  Skin: Induration and erythema at the apex of her gluteal cleft consistent with pilonidal abscess Psychiatric: Mood and affect are normal. Speech and behavior are normal.  ____________________________________________  ED COURSE:  As part of my medical decision making, I reviewed the following data within the electronic MEDICAL RECORD NUMBER History obtained from family if available, nursing notes, old chart and ekg, as well as notes from prior ED visits. Patient presented for pilonidal abscess, she will require incision and drainage   .Marland Kitchen.Incision and Drainage Date/Time: 09/03/2018 9:10 PM Performed by: Emily FilbertWilliams, Haim Hansson E, MD Authorized by: Emily FilbertWilliams, Amariya Liskey E, MD   Consent:    Consent obtained:  Verbal  Consent given by:  Patient   Risks discussed:  Bleeding, infection, incomplete drainage and pain   Alternatives discussed:  Alternative treatment, delayed treatment and observation Location:    Type:  Pilonidal cyst   Location:  Anogenital   Anogenital location:  Pilonidal Pre-procedure details:    Skin preparation:  Betadine Sedation:    Sedation type:  Anxiolysis Anesthesia (see MAR for exact dosages):    Anesthesia method:  Local infiltration   Local anesthetic:  Lidocaine 1% WITH epi Procedure type:    Complexity:  Complex Procedure details:    Incision types:   Single straight   Scalpel blade:  11   Wound management:  Probed and deloculated   Drainage amount:  Moderate   Wound treatment:  Drain placed   Packing materials:  1/4 in gauze Post-procedure details:    Patient tolerance of procedure:  Tolerated well, no immediate complications   ____________________________________________  DIFFERENTIAL DIAGNOSIS   Pilonidal abscess, cyst, cellulitis  FINAL ASSESSMENT AND PLAN  Pilonidal cyst  Plan: The patient had presented for worsening of her pilonidal cyst.  Patient is undergone successful incision and drainage as dictated above.  She be discharged with pain medicine and encouraged to continue taking antibiotics as prescribed.   Ulice Dash, MD   Note: This note was generated in part or whole with voice recognition software. Voice recognition is usually quite accurate but there are transcription errors that can and very often do occur. I apologize for any typographical errors that were not detected and corrected.     Emily Filbert, MD 09/03/18 2111

## 2018-12-15 ENCOUNTER — Ambulatory Visit: Payer: BLUE CROSS/BLUE SHIELD

## 2018-12-21 ENCOUNTER — Ambulatory Visit: Payer: BLUE CROSS/BLUE SHIELD

## 2019-02-11 ENCOUNTER — Encounter: Payer: Self-pay | Admitting: Family Medicine

## 2019-02-11 ENCOUNTER — Ambulatory Visit (INDEPENDENT_AMBULATORY_CARE_PROVIDER_SITE_OTHER): Payer: BLUE CROSS/BLUE SHIELD | Admitting: Family Medicine

## 2019-02-11 VITALS — BP 120/70 | HR 72 | Ht 70.0 in | Wt 280.0 lb

## 2019-02-11 DIAGNOSIS — E010 Iodine-deficiency related diffuse (endemic) goiter: Secondary | ICD-10-CM

## 2019-02-11 DIAGNOSIS — Z021 Encounter for pre-employment examination: Secondary | ICD-10-CM

## 2019-02-11 DIAGNOSIS — Z23 Encounter for immunization: Secondary | ICD-10-CM

## 2019-02-11 NOTE — Progress Notes (Signed)
Date:  02/11/2019   Name:  Ruth Garner   DOB:  28-Apr-1996   MRN:  [id number]   Chief Complaint: Employment Physical (forms completed) and tdap vacc  Patient is a 23 year old female who presents for a employment physical exam. The patient reports the following problems: none. Health maintenance has been reviewed needs tetanus.   Review of Systems  Constitutional: Negative.  Negative for chills, fatigue, fever and unexpected weight change.  HENT: Negative for congestion, ear discharge, ear pain, rhinorrhea, sinus pressure, sneezing and sore throat.   Eyes: Negative for photophobia, pain, discharge, redness and itching.  Respiratory: Negative for cough, shortness of breath, wheezing and stridor.   Gastrointestinal: Negative for abdominal pain, blood in stool, constipation, diarrhea, nausea and vomiting.  Endocrine: Negative for cold intolerance, heat intolerance, polydipsia, polyphagia and polyuria.  Genitourinary: Negative for dysuria, flank pain, frequency, hematuria, menstrual problem, pelvic pain, urgency, vaginal bleeding and vaginal discharge.  Musculoskeletal: Negative for arthralgias, back pain and myalgias.  Skin: Negative for rash.  Allergic/Immunologic: Negative for environmental allergies and food allergies.  Neurological: Negative for dizziness, weakness, light-headedness, numbness and headaches.  Hematological: Negative for adenopathy. Does not bruise/bleed easily.  Psychiatric/Behavioral: Negative for dysphoric mood. The patient is not nervous/anxious.     Patient Active Problem List   Diagnosis Date Noted  . Thyroiditis 04/14/2017  . Near syncope 04/14/2017  . Tachycardia 04/14/2017  . Bronchitis 04/14/2017  . 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  Social History   Tobacco Use  . Smoking status: Former Smoker    Packs/day: 0.50    Types: Cigarettes  . Smokeless tobacco: Never  Used  Substance Use Topics  . Alcohol use: No    Alcohol/week: 0.0 standard drinks  . Drug use: No     Medication list has been reviewed and updated.  No outpatient medications have been marked as taking for the 02/11/19 encounter (Office Visit) with Duanne Limerick, MD.    [facility] 2/9 Scores 01/27/2018 01/16/2018 02/29/2016 02/09/2016  PHQ - 2 Score 0 6 0 1  PHQ- 9 Score 1 17 - -    Physical Exam Vitals signs and nursing note reviewed.  Constitutional:      General: She is not in acute distress.    Appearance: She is not diaphoretic.  HENT:     Head: Normocephalic and atraumatic.     Right Ear: External ear normal.     Left Ear: External ear normal.     Nose: Nose normal.  Eyes:     General:        Right eye: No discharge.        Left eye: No discharge.     Conjunctiva/sclera: Conjunctivae normal.     Pupils: Pupils are equal, round, and reactive to light.  Neck:     Musculoskeletal: Normal range of motion and neck supple.     Thyroid: No thyromegaly.     Vascular: No JVD.  Cardiovascular:     Rate and Rhythm: Normal rate and regular rhythm.     Heart sounds: Normal heart sounds. No murmur. No friction rub. No gallop.   Pulmonary:     Effort: Pulmonary effort is normal.     Breath sounds: Normal breath sounds.  Abdominal:     General: Bowel sounds are normal.     Palpations: Abdomen is soft. There is no mass.     Tenderness:  There is no abdominal tenderness. There is no guarding.  Musculoskeletal: Normal range of motion.  Lymphadenopathy:     Cervical: No cervical adenopathy.  Skin:    General: Skin is warm and dry.  Neurological:     Mental Status: She is alert.     Deep Tendon Reflexes: Reflexes are normal and symmetric.     BP 120/70   Pulse 72   Ht 5\' 10"  (1.778 m)   Wt 280 lb (127 kg)   BMI 40.18 kg/m   Assessment and Plan: 1. Physical exam, pre-employment No subjective/objective concerns during history/physical exam.  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  2. Thyromegaly And has seen an endocrinologist for which she did not get much information concerning her enlarged thyroid.  2012 patient had a an ultrasound of the thyroid which noted sounded like a multicystic goiter.  Will obtain an ultrasound to evaluate. - US Soft Tissue Head/Neck; Future  3. Need for diphtheria-tetanus-pertussis (Tdap) vaccine Discussed and administered. - Tdap vaccine greater than or equal to 7yo IM

## 2019-02-16 ENCOUNTER — Ambulatory Visit
Admission: RE | Admit: 2019-02-16 | Discharge: 2019-02-16 | Disposition: A | Discharge status: Home / Self Care | Payer: BLUE CROSS/BLUE SHIELD | Source: Ambulatory Visit | Attending: Family Medicine | Admitting: Family Medicine

## 2019-02-16 DIAGNOSIS — E010 Iodine-deficiency related diffuse (endemic) goiter: Secondary | ICD-10-CM | POA: Diagnosis not present

## 2019-03-11 ENCOUNTER — Encounter: Payer: Self-pay | Admitting: Family Medicine

## 2019-03-11 ENCOUNTER — Ambulatory Visit (INDEPENDENT_AMBULATORY_CARE_PROVIDER_SITE_OTHER): Payer: BLUE CROSS/BLUE SHIELD | Admitting: Family Medicine

## 2019-03-11 VITALS — Temp 98.6°F

## 2019-03-11 DIAGNOSIS — R509 Fever, unspecified: Secondary | ICD-10-CM | POA: Diagnosis not present

## 2019-03-11 DIAGNOSIS — R197 Diarrhea, unspecified: Secondary | ICD-10-CM

## 2019-03-11 NOTE — Progress Notes (Signed)
Date:  03/11/2019   Name:  Ruth Garner   DOB:  1996-09-16   MRN:  [id number]   Chief Complaint: No chief complaint on file.  I connected withthis patient, Ruth Garner, by telephoneand verified that I am speaking with the correct person using two identifiers. This visit was conducted via phone due to the Covid-19 outbreak. I conducted the telephonic visit from my office at [facility] in [place], [place]. I discussed the limitations, risks, security and privacy concerns of performing an evaluation and management service by telephone. I also discussed with the patient that there may be a patient responsible charge related to this service. The patient expressed understanding and agreed to proceed.  Diarrhea   This is a new problem. The current episode started in the past 7 days. The problem occurs 2 to 4 times per day. The problem has been gradually improving. Diarrhea characteristics: soft. Associated symptoms include chills, coughing and a fever. Pertinent negatives include no abdominal pain, arthralgias, bloating, headaches, increased  flatus, myalgias, sweats, URI, vomiting or weight loss. There are no known risk factors (daycare). She has tried nothing for the symptoms.  Fever   This is a new problem. The current episode started in the past 7 days (monday). Episode frequency: last day fever tuesday. The problem has been gradually improving. The maximum temperature noted was 100 to 100.9 F. The temperature was taken using an oral thermometer. Associated symptoms include congestion, coughing, diarrhea and a sore throat. Pertinent negatives include no abdominal pain, chest pain, ear pain, headaches, muscle aches, nausea, rash, sleepiness, urinary pain, vomiting or wheezing. She has tried acetaminophen (none/  from tuesday) for the symptoms. The treatment provided moderate relief.  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 Negative for diaphoresis, fatigue, unexpected weight change and weight loss.  HENT: Positive for congestion and sore throat. Negative for ear discharge, ear pain, rhinorrhea, sinus pressure and sneezing.   Eyes: Negative for photophobia, pain, discharge, redness and itching.  Respiratory: Positive for cough. Negative for chest tightness, shortness of breath, wheezing and stridor.   Cardiovascular: Negative for chest pain.  Gastrointestinal: Positive for diarrhea. Negative for abdominal pain, bloating, blood in stool, constipation, flatus, nausea and vomiting.  Endocrine: Negative for cold intolerance, heat intolerance, polydipsia, polyphagia and polyuria.  Genitourinary: Negative for dysuria, flank pain, frequency, hematuria, menstrual problem, pelvic pain, urgency, vaginal bleeding and vaginal discharge.  Musculoskeletal: Negative for arthralgias, back pain and myalgias.  Skin: Negative for rash.  Allergic/Immunologic: Negative for environmental allergies and food allergies.  Neurological: Negative for dizziness, weakness, light-headedness, numbness and headaches.  Hematological: Negative for adenopathy. Does not bruise/bleed easily.  Psychiatric/Behavioral: Negative for dysphoric mood. The patient is not nervous/anxious.     Patient Active Problem List   Diagnosis Date Noted  . Thyroiditis 04/14/2017  . Near syncope 04/14/2017  . Tachycardia 04/14/2017  . Bronchitis 04/14/2017  . 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  Social History   Tobacco Use  . Smoking status: Former Smoker    Packs/day: 0.50    Types: Cigarettes  . Smokeless tobacco: Never Used  Substance Use Topics  . Alcohol use: No    Alcohol/week: 0.0 standard drinks  . Drug use: No     Medication list has been reviewed and updated.  No outpatient medications have been  marked as taking for the 03/11/19 encounter (Appointment) with Juline Patch, MD.    [facility] 2/9 Scores 01/27/2018 01/16/2018 02/29/2016 02/09/2016  PHQ - 2 Score 0 6 0 1  PHQ- 9 Score 1 17 - -    Physical Exam Vitals signs (Tempature confirmed/ no physical exam performed) and nursing note reviewed.  Neurological:     Mental Status: She is alert.     Wt Readings from Last 3 Encounters:  02/11/19 280 lb (127 kg)  09/03/18 272 lb 0.8 oz (123.4 kg)  09/01/18 272 lb (123.4 kg)    There were no vitals taken for this visit.  Assessment and Plan: 1. Fever in adult Underwent a telemedicine service for fever did by telephone.  Patient lives at home and sent was obtained location of provider was at the office by myself Dr. Otilio Miu.  Foye Clock did the check and and noted patient's temperature the vital signs.  Is a virtual telephone note as follows: Patient had onset of fever on Monday which was low-grade in the 100.5 range.  There was a mild cough with no shortness of breath no chest pain or cyanosis.  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  Patient is gradually improved with no fever since Tuesday.  Patient was told to stay away from others and to limit contact with people and remain in isolation at home.  Patient was given circumstances for which she is to go to the hospital including shortness of breath chest pain cyanosis and new onset confusion.  Patient was encouraged to wash hands after touching face and use hand sanitizer.  Will be given instructions for possible return including 3 days if no fever without use of Tylenol or antipyretics, elution of symptoms of cough or shortness of breath, and in days have passed since Monday that the symptoms first appeared.  Patient will be called on Monday morning concerning her condition and if these have been met met patient will be given a note to return to work. I spent 10 minutes with this patient, More  than 50% of that time was spent in telephone, education, counseling and care coordination.  2. Diarrhea, unspecified type Patient has had diarrhea which is gradually improved.  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

## 2019-03-15 ENCOUNTER — Telehealth: Payer: Self-pay

## 2019-03-15 NOTE — Telephone Encounter (Signed)
Called to check on pt this am- last time she took Tylenol was on Friday the 27th. Has not had a fever since then. 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 Therefore, she will be out of work until then. No current side effects or concerns

## 2019-05-19 ENCOUNTER — Encounter: Payer: Self-pay | Admitting: Emergency Medicine

## 2019-05-19 ENCOUNTER — Emergency Department
Admission: EM | Admit: 2019-05-19 | Discharge: 2019-05-19 | Disposition: A | Discharge status: Home / Self Care | Payer: BC Managed Care – PPO | Attending: Emergency Medicine | Admitting: Emergency Medicine

## 2019-05-19 ENCOUNTER — Other Ambulatory Visit: Payer: Self-pay

## 2019-05-19 ENCOUNTER — Emergency Department: Payer: BC Managed Care – PPO

## 2019-05-19 DIAGNOSIS — R079 Chest pain, unspecified: Secondary | ICD-10-CM | POA: Diagnosis not present

## 2019-05-19 DIAGNOSIS — R42 Dizziness and giddiness: Secondary | ICD-10-CM

## 2019-05-19 DIAGNOSIS — Z87891 Personal history of nicotine dependence: Secondary | ICD-10-CM | POA: Insufficient documentation

## 2019-05-19 DIAGNOSIS — F419 Anxiety disorder, unspecified: Secondary | ICD-10-CM | POA: Diagnosis not present

## 2019-05-19 LAB — TROPONIN I: Troponin I: <0.03 ng/mL (ref <0.03)

## 2019-05-19 LAB — CBC
HCT: 39.7 % (ref 36.0–46.0)
Hemoglobin: 13.4 g/dL (ref 12.0–15.0)
MCH: 28.9 pg (ref 26.0–34.0)
MCHC: 33.8 g/dL (ref 30.0–36.0)
MCV: 85.6 fL (ref 80.0–100.0)
Platelets: 296 10*3/uL (ref 150–400)
RBC: 4.64 MIL/uL (ref 3.87–5.11)
RDW: 12.6 % (ref 11.5–15.5)
WBC: 9.6 10*3/uL (ref 4.0–10.5)
nRBC: 0 % (ref 0.0–0.2)

## 2019-05-19 LAB — BASIC METABOLIC PANEL
Anion gap: 10 (ref 5–15)
BUN: 12 mg/dL (ref 6–20)
CO2: 22 mmol/L (ref 22–32)
Calcium: 9.1 mg/dL (ref 8.9–10.3)
Chloride: 105 mmol/L (ref 98–111)
Creatinine, Ser: 0.53 mg/dL (ref 0.44–1.00)
GFR calc Af Amer: >60 mL/min (ref >60)
GFR calc non Af Amer: >60 mL/min (ref >60)
Glucose, Bld: 152 mg/dL — ABNORMAL HIGH (ref 70–99)
Potassium: 4 mmol/L (ref 3.5–5.1)
Sodium: 137 mmol/L (ref 135–145)

## 2019-05-19 NOTE — ED Provider Notes (Signed)
[facility] Emergency Department Provider Note   ____________________________________________   I have reviewed the triage vital signs and the nursing notes.   HISTORY  Chief Complaint Dizziness and Chest Pain   History limited by: Not Limited   HPI Ruth Garner is a 23 y.o. female who presents to the emergency department today because of an episode of chest discomfort and dizziness.  The patient states that she has had this happen to her many times.  She thinks it happens primarily when she gets hot at work and her blood pressure elevates.  She at one time has been prescribed metoprolol but is currently not taking it.  At the time my exam the patient states she feels better.  She denies any fevers, nausea vomiting or diarrhea.  Records reviewed. Per medical record review patient has a history of anxiety, thyroditis  Past Medical History:  Diagnosis Date  . Anxiety   . Heart murmur     Patient Active Problem List   Diagnosis Date Noted  . Thyroiditis 04/14/2017  . Near syncope 04/14/2017  . Tachycardia 04/14/2017  . Bronchitis 04/14/2017  . 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 No pertinent surgical history.  Prior to Admission medications   Not on File    Allergies Patient has no known allergies.  Family History  Problem Relation Age of Onset  . Diabetes Mother   . Hypertension Father     Social History Social History   Tobacco Use  . Smoking status: Former Smoker    Packs/day: 0.50    Types: Cigarettes  . Smokeless tobacco: Never Used  Substance Use Topics  . Alcohol use: No    Alcohol/week: 0.0 standard drinks  . Drug use: No    Review of Systems Constitutional: No fever/chills Eyes: No visual changes. ENT: No sore throat. Cardiovascular: Positive for chest discomfort.  Respiratory: Denies shortness of breath. Gastrointestinal: No abdominal pain.  No nausea, no vomiting.  No  diarrhea.   Genitourinary: Negative for dysuria. Musculoskeletal: Negative for back pain. Skin: Negative for rash. Neurological: Negative for headaches, focal weakness or numbness.  ____________________________________________   PHYSICAL EXAM:  VITAL SIGNS: ED Triage Vitals  Enc Vitals Group     BP 05/19/19 1511 137/84     Pulse Rate 05/19/19 1511 95     Resp 05/19/19 1511 18     Temp 05/19/19 1511 97.7 F (36.5 C)     Temp Source 05/19/19 1511 Oral     SpO2 05/19/19 1511 98 %     Weight 05/19/19 1510 280 lb (127 kg)     Height 05/19/19 1510 5\' 11"  (1.803 m)     Head Circumference --      Peak Flow --      Pain Score 05/19/19 1512 5   Constitutional: Alert and oriented.  Eyes: Conjunctivae are normal.  ENT      Head: Normocephalic and atraumatic.      Nose: No congestion/rhinnorhea.      Mouth/Throat: Mucous membranes are moist.      Neck: No stridor. Hematological/Lymphatic/Immunilogical: No cervical lymphadenopathy. Cardiovascular: Normal rate, regular rhythm.  No murmurs, rubs, or gallops.  Respiratory: Normal respiratory effort without tachypnea nor retractions. Breath sounds are clear and equal bilaterally. No wheezes/rales/rhonchi. Gastrointestinal: Soft and non tender. No rebound. No guarding.  Genitourinary: Deferred Musculoskeletal: Normal range of motion in all extremities. No lower extremity edema. Neurologic:  Normal speech and language. No gross focal neurologic deficits are  appreciated.  Skin:  Skin is warm, dry and intact. No rash noted. Psychiatric: Mood and affect are normal. Speech and behavior are normal. Patient exhibits appropriate insight and judgment.  ____________________________________________    LABS (pertinent positives/negatives)  Trop <0.03 CBC wbc 9.6, hgb 13.4, plt 296 BMP wnl except glu 152  ____________________________________________   EKG  I, Phineas Semen, attending physician, personally viewed and interpreted this  EKG  EKG Time: 1517 Rate: 109 Rhythm: sinus tachycardia Axis: left axis deviation Intervals: qtc 439 QRS: narrow ST changes: no st elevation Impression: abnormal ekg  ____________________________________________    RADIOLOGY  CXR No acute disease  ____________________________________________   PROCEDURES  Procedures  ____________________________________________   INITIAL IMPRESSION / ASSESSMENT AND PLAN / ED COURSE  Pertinent labs & imaging results that were available during my care of the patient were reviewed by me and considered in my medical decision making (see chart for details).   Patient presented to the emergency department today concern for episode of chest discomfort and dizziness which had resolved by the time my exam.  She states this is happened to her somewhat frequently.  On exam patient appears well.  Blood work, EKG and chest x-ray without concerning findings.  Discussed with patient importance of following up with primary care for further blood pressure management.  ____________________________________________   FINAL CLINICAL IMPRESSION(S) / ED DIAGNOSES  Final diagnoses:  Dizziness  Chest pain, unspecified type     Note: This dictation was prepared with Dragon dictation. Any transcriptional errors that result from this process are unintentional     Phineas Semen, MD 05/19/19 2041

## 2019-05-19 NOTE — ED Notes (Signed)
EDP at bedside  

## 2019-05-19 NOTE — ED Notes (Signed)
Pt states her dizziness and chest pain have gone away- no other complaints at this time

## 2019-05-19 NOTE — ED Notes (Signed)
Pt back to room from xray.

## 2019-05-19 NOTE — ED Triage Notes (Signed)
First RN Note: Pt presents to ED via POV with c/o dizziness, weakness, and fatigue. Pt ambulatory without difficulty at this time.

## 2019-05-19 NOTE — ED Triage Notes (Signed)
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 VSS. Pt also c/o mid chest pain 5/10.

## 2019-05-19 NOTE — Discharge Instructions (Addendum)
Please seek medical attention for any high fevers, chest pain, shortness of breath, change in behavior, persistent vomiting, bloody stool or any other new or concerning symptoms.  

## 2019-05-20 ENCOUNTER — Encounter: Payer: Self-pay | Admitting: Family Medicine

## 2019-05-20 ENCOUNTER — Ambulatory Visit (INDEPENDENT_AMBULATORY_CARE_PROVIDER_SITE_OTHER): Payer: BC Managed Care – PPO | Admitting: Family Medicine

## 2019-05-20 VITALS — Ht 71.0 in | Wt 294.0 lb

## 2019-05-20 DIAGNOSIS — F320 Major depressive disorder, single episode, mild: Secondary | ICD-10-CM | POA: Diagnosis not present

## 2019-05-20 DIAGNOSIS — E049 Nontoxic goiter, unspecified: Secondary | ICD-10-CM | POA: Diagnosis not present

## 2019-05-20 DIAGNOSIS — R1312 Dysphagia, oropharyngeal phase: Secondary | ICD-10-CM | POA: Diagnosis not present

## 2019-05-20 DIAGNOSIS — E063 Autoimmune thyroiditis: Secondary | ICD-10-CM

## 2019-05-20 DIAGNOSIS — R42 Dizziness and giddiness: Secondary | ICD-10-CM

## 2019-05-20 MED ORDER — SERTRALINE HCL 50 MG PO TABS: 50.0000 mg | ORAL_TABLET | Freq: Every day | ORAL | 1 refills | Status: DC

## 2019-05-20 NOTE — Progress Notes (Signed)
Date:  05/20/2019   Name:  Ruth Garner   DOB:  [birth year]   MRN:  [id number]   Chief Complaint: Hypertension ("feels it getting high and gets dizzy or really tired"- can't take metoprolol. ) and Depression (GAD7=8 and PHQ9=16)  Hypertension  This is a chronic problem. The current episode started more than 1 year ago. The problem has been rapidly improving since onset. The problem is controlled. Associated symptoms include shortness of breath. Pertinent negatives include no anxiety, blurred vision, chest pain, headaches, malaise/fatigue, neck pain, orthopnea, palpitations, peripheral edema, PND or sweats. There are no associated agents to hypertension. There are no known risk factors for coronary artery disease. Past treatments include beta blockers (off meds now). There are no compliance problems.  There is no history of angina, kidney disease, CAD/MI, CVA, heart failure, left ventricular hypertrophy, PVD or retinopathy. There is no history of chronic renal disease, a hypertension causing med or renovascular disease.  Depression         This is a chronic problem.  The onset quality is sudden.   The problem has been resolved since onset.  Associated symptoms include helplessness, hopelessness, insomnia, irritable, restlessness, decreased interest and sad.  Associated symptoms include no decreased concentration, no fatigue, no appetite change, no body aches, no myalgias, no headaches, no indigestion and no suicidal ideas.  Past treatments include nothing.  Compliance with treatment is good.   Pertinent negatives include no anxiety.   Review of Systems  Constitutional: Negative.  Negative for appetite change, chills, fatigue, fever, malaise/fatigue and unexpected weight change.  HENT: Positive for voice change. Negative for congestion, ear discharge, ear pain, rhinorrhea, sinus pressure, sneezing, sore throat and trouble swallowing.   Eyes: Negative for blurred vision, photophobia, pain,  discharge, redness and itching.  Respiratory: Positive for shortness of breath. Negative for cough, wheezing and stridor.        Supine/dyspnea  Cardiovascular: Negative for chest pain, palpitations, orthopnea and PND.  Gastrointestinal: Negative for abdominal pain, blood in stool, constipation, diarrhea, nausea and vomiting.       Dysphagia/couple months  Endocrine: Negative for cold intolerance, heat intolerance, polydipsia, polyphagia and polyuria.  Genitourinary: Negative for dysuria, flank pain, frequency, hematuria, menstrual problem, pelvic pain, urgency, vaginal bleeding and vaginal discharge.  Musculoskeletal: Negative for arthralgias, back pain, myalgias and neck pain.  Skin: Negative for rash.  Allergic/Immunologic: Negative for environmental allergies and food allergies.  Neurological: Negative for dizziness, weakness, light-headedness, numbness and headaches.  Hematological: Negative for adenopathy. Does not bruise/bleed easily.  Psychiatric/Behavioral: Positive for depression. Negative for decreased concentration, dysphoric mood and suicidal ideas. The patient has insomnia. The patient is not nervous/anxious.     Patient Active Problem List   Diagnosis Date Noted  . Thyroiditis 04/14/2017  . Near syncope 04/14/2017  . Tachycardia 04/14/2017  . Bronchitis 04/14/2017  . Autoimmune lymphocytic chronic thyroiditis 04/14/2017    Allergies  Allergen Reactions  . Metoprolol     Makes me what to kill myself    No past surgical history on file.  Social History   Tobacco Use  . Smoking status: Current Every Day Smoker    Packs/day: 0.50    Types: Cigarettes  . Smokeless tobacco: Never Used  Substance Use Topics  . Alcohol use: No    Alcohol/week: 0.0 standard drinks  . Drug use: No     Medication list has been reviewed and updated.  No outpatient medications have been marked as taking for the 05/20/19  encounter (Office Visit) with Duanne Limerick, MD.    [facility] 2/9  Scores 05/20/2019 01/27/2018 01/16/2018 02/29/2016  PHQ - 2 Score 4 0 6 0  PHQ- 9 Score -    BP Readings from Last 3 Encounters:  05/19/19 127/68  02/11/19 120/70  09/03/18 117/81    Physical Exam Vitals signs and nursing note reviewed.  Constitutional:      General: She is irritable.     Appearance: She is well-developed.  HENT:     Head: Normocephalic.     Right Ear: External ear normal.     Left Ear: External ear normal.  Eyes:     General: Lids are everted, no foreign bodies appreciated. No scleral icterus.       Left eye: No foreign body or hordeolum.     Conjunctiva/sclera: Conjunctivae normal.     Right eye: Right conjunctiva is not injected.     Left eye: Left conjunctiva is not injected.     Pupils: Pupils are equal, round, and reactive to light.  Neck:     Musculoskeletal: Normal range of motion and neck supple.     Thyroid: Thyromegaly present. No thyroid mass or thyroid tenderness.     Vascular: No JVD.     Trachea: No tracheal deviation.  Cardiovascular:     Rate and Rhythm: Normal rate and regular rhythm.     Heart sounds: Normal heart sounds. No murmur. No friction rub. No gallop.   Pulmonary:     Effort: Pulmonary effort is normal. No respiratory distress.     Breath sounds: Normal breath sounds. No wheezing or rales.  Abdominal:     General: Bowel sounds are normal.     Palpations: Abdomen is soft. There is no mass.     Tenderness: There is no abdominal tenderness. There is no guarding or rebound.  Musculoskeletal: Normal range of motion.        General: No tenderness.  Lymphadenopathy:     Cervical: No cervical adenopathy.     Right cervical: No superficial, deep or posterior cervical adenopathy.    Left cervical: No superficial, deep or posterior cervical adenopathy.  Skin:    General: Skin is warm.     Findings: No rash.  Neurological:     Mental Status: She is alert and oriented to person, place, and time.     Cranial Nerves: No cranial  nerve deficit.     Deep Tendon Reflexes: Reflexes normal.  Psychiatric:        Mood and Affect: Mood is not anxious or depressed.     Wt Readings from Last 3 Encounters:  05/20/19 294 lb (133.4 kg)  05/19/19 280 lb (127 kg)  02/11/19 280 lb (127 kg)    Ht  (1.803 m)   Wt 294 lb (133.4 kg)   LMP 05/05/2019 (Approximate)   BMI 41.00 kg/m   Assessment and Plan:  1. Euthyroid goiter Chronic.  Patient is now becoming more symptomatic with dysphasia and some difficulty with snoring when she is lying supine.  Patient had difficulty getting being contacted by endocrinology and we can retry it again but try rerouted through our nurse to make sure we can get her and will call to confirm a good address and a good telephone number. - Ambulatory referral to Endocrinology  2. Current mild episode of major depressive disorder, unspecified whether recurrent (HCC) Patient PHQ was 16 and gad was 8 on score today.  Will refer to  psychiatry for evaluation and treatment - Ambulatory referral to Psychiatry  3. Oropharyngeal dysphagia Oropharyngeal dysphasia as noted possibly due to enlarged goiter. - Ambulatory referral to Endocrinology  4. Autoimmune lymphocytic chronic thyroiditis This is a history of this that I have found in her notes I do not know with the verification of this is and I cannot find any pathology.  Refer to endocrinology to see if we can do anything about this either subtotal or total thyroidectomy or elation of some type. - Ambulatory referral to Endocrinology  5. Orthostatic dizziness Patient has had increased dizziness with change in position she used to be on blood pressure medication for which she is not on although this was mostly used for rate control due to palpitations.  She was encouraged to increase fluid intake.

## 2019-05-20 NOTE — Addendum Note (Signed)
Addended by: Everitt Amber on: 05/20/2019 04:40 PM   Modules accepted: Orders

## 2019-05-24 ENCOUNTER — Telehealth: Payer: Self-pay | Admitting: Family Medicine

## 2019-05-24 ENCOUNTER — Other Ambulatory Visit: Payer: Self-pay

## 2019-05-24 NOTE — Progress Notes (Unsigned)
Spoke to pt- sent to ER for continuing dizziness and near syncope

## 2019-05-24 NOTE — Telephone Encounter (Signed)
Pt was seen in ER on 6/3 and then seen here on 6/4- if still having dizziness and feelings of passing out- go to ER

## 2019-05-24 NOTE — Telephone Encounter (Signed)
Patient is having dizziness, off balance, blurred vision, shortness of breath and headache.

## 2019-06-03 DIAGNOSIS — E049 Nontoxic goiter, unspecified: Secondary | ICD-10-CM | POA: Diagnosis not present

## 2019-06-08 ENCOUNTER — Other Ambulatory Visit: Payer: Self-pay

## 2019-06-08 DIAGNOSIS — F320 Major depressive disorder, single episode, mild: Secondary | ICD-10-CM

## 2019-07-20 ENCOUNTER — Other Ambulatory Visit: Payer: Self-pay | Admitting: Family Medicine

## 2019-07-20 DIAGNOSIS — F320 Major depressive disorder, single episode, mild: Secondary | ICD-10-CM

## 2019-08-27 ENCOUNTER — Other Ambulatory Visit: Payer: Self-pay

## 2019-08-27 ENCOUNTER — Encounter: Payer: Self-pay | Admitting: Emergency Medicine

## 2019-08-27 ENCOUNTER — Emergency Department
Admission: EM | Admit: 2019-08-27 | Discharge: 2019-08-27 | Disposition: A | Discharge status: Home / Self Care | Payer: BC Managed Care – PPO | Attending: Emergency Medicine | Admitting: Emergency Medicine

## 2019-08-27 DIAGNOSIS — Z5321 Procedure and treatment not carried out due to patient leaving prior to being seen by health care provider: Secondary | ICD-10-CM | POA: Insufficient documentation

## 2019-08-27 DIAGNOSIS — R42 Dizziness and giddiness: Secondary | ICD-10-CM | POA: Insufficient documentation

## 2019-08-27 DIAGNOSIS — R079 Chest pain, unspecified: Secondary | ICD-10-CM | POA: Insufficient documentation

## 2019-08-27 LAB — COMPREHENSIVE METABOLIC PANEL
ALT: 30 U/L (ref 0–44)
AST: 25 U/L (ref 15–41)
Albumin: 4.2 g/dL (ref 3.5–5.0)
Alkaline Phosphatase: 43 U/L (ref 38–126)
Anion gap: 10 (ref 5–15)
BUN: 15 mg/dL (ref 6–20)
CO2: 23 mmol/L (ref 22–32)
Calcium: 9.1 mg/dL (ref 8.9–10.3)
Chloride: 103 mmol/L (ref 98–111)
Creatinine, Ser: 0.74 mg/dL (ref 0.44–1.00)
GFR calc Af Amer: >60 mL/min (ref >60)
GFR calc non Af Amer: >60 mL/min (ref >60)
Glucose, Bld: 196 mg/dL — ABNORMAL HIGH (ref 70–99)
Potassium: 4 mmol/L (ref 3.5–5.1)
Sodium: 136 mmol/L (ref 135–145)
Total Bilirubin: 0.5 mg/dL (ref 0.3–1.2)
Total Protein: 7.7 g/dL (ref 6.5–8.1)

## 2019-08-27 LAB — CBC WITH DIFFERENTIAL/PLATELET
Abs Immature Granulocytes: 0.05 10*3/uL (ref 0.00–0.07)
Basophils Absolute: 0 10*3/uL (ref 0.0–0.1)
Basophils Relative: 0 %
Eosinophils Absolute: 0.1 10*3/uL (ref 0.0–0.5)
Eosinophils Relative: 1 %
HCT: 40.4 % (ref 36.0–46.0)
Hemoglobin: 14 g/dL (ref 12.0–15.0)
Immature Granulocytes: 0 %
Lymphocytes Relative: 22 %
Lymphs Abs: 2.6 10*3/uL (ref 0.7–4.0)
MCH: 29.7 pg (ref 26.0–34.0)
MCHC: 34.7 g/dL (ref 30.0–36.0)
MCV: 85.6 fL (ref 80.0–100.0)
Monocytes Absolute: 0.6 10*3/uL (ref 0.1–1.0)
Monocytes Relative: 5 %
Neutro Abs: 8.4 10*3/uL — ABNORMAL HIGH (ref 1.7–7.7)
Neutrophils Relative %: 72 %
Platelets: 291 10*3/uL (ref 150–400)
RBC: 4.72 MIL/uL (ref 3.87–5.11)
RDW: 12.7 % (ref 11.5–15.5)
WBC: 11.7 10*3/uL — ABNORMAL HIGH (ref 4.0–10.5)
nRBC: 0 % (ref 0.0–0.2)

## 2019-08-27 LAB — TROPONIN I (HIGH SENSITIVITY): Troponin I (High Sensitivity): <2 ng/L (ref <18)

## 2019-08-27 NOTE — ED Notes (Signed)
No answer when called several times from lobby & outside 

## 2019-08-27 NOTE — ED Notes (Addendum)
No answer when called several times from lobby & outside 

## 2019-08-27 NOTE — ED Triage Notes (Signed)
Pt to triage via w/c, mask in place with no distress noted; pt reports PTA had "squeezing" in her chest f/u with tachycardia and dizzness; st hx of same and was dx with "palpitations"

## 2019-12-08 ENCOUNTER — Ambulatory Visit: Payer: HRSA Program | Attending: Internal Medicine

## 2019-12-08 DIAGNOSIS — Z20822 Contact with and (suspected) exposure to covid-19: Secondary | ICD-10-CM

## 2019-12-08 DIAGNOSIS — Z20828 Contact with and (suspected) exposure to other viral communicable diseases: Secondary | ICD-10-CM | POA: Diagnosis present

## 2019-12-08 DIAGNOSIS — U071 COVID-19: Secondary | ICD-10-CM | POA: Insufficient documentation

## 2019-12-09 LAB — NOVEL CORONAVIRUS, NAA: SARS-CoV-2, NAA: DETECTED — AB

## 2019-12-13 ENCOUNTER — Telehealth: Payer: Self-pay

## 2019-12-13 NOTE — Telephone Encounter (Signed)
Per pt. Request, COVID results mailed to her home address on file.

## 2020-04-25 ENCOUNTER — Other Ambulatory Visit: Payer: Self-pay

## 2020-04-25 ENCOUNTER — Ambulatory Visit (LOCAL_COMMUNITY_HEALTH_CENTER): Payer: Self-pay | Admitting: Physician Assistant

## 2020-04-25 VITALS — BP 112/78 | Ht 70.0 in | Wt 277.0 lb

## 2020-04-25 DIAGNOSIS — Z3009 Encounter for other general counseling and advice on contraception: Secondary | ICD-10-CM

## 2020-04-25 DIAGNOSIS — Z30012 Encounter for prescription of emergency contraception: Secondary | ICD-10-CM

## 2020-04-25 MED ORDER — ELLA 30 MG PO TABS: 1.0000 | ORAL_TABLET | Freq: Once | ORAL | 0 refills | Status: AC

## 2020-05-01 ENCOUNTER — Encounter: Payer: Self-pay | Admitting: Physician Assistant

## 2020-05-01 NOTE — Progress Notes (Signed)
Patient interested in RTC for PE, pap, STD screening and to discuss starting BCM.  [facility] info given to patient to review and rec that patient RTC in 2-3 weeks for appointment as [facility].

## 2020-07-10 ENCOUNTER — Ambulatory Visit: Payer: Self-pay | Admitting: Family Medicine

## 2020-07-10 ENCOUNTER — Other Ambulatory Visit: Payer: Self-pay

## 2020-07-10 ENCOUNTER — Encounter: Payer: Self-pay | Admitting: Family Medicine

## 2020-07-10 DIAGNOSIS — F32A Depression, unspecified: Secondary | ICD-10-CM

## 2020-07-10 DIAGNOSIS — Z113 Encounter for screening for infections with a predominantly sexual mode of transmission: Secondary | ICD-10-CM

## 2020-07-10 DIAGNOSIS — F419 Anxiety disorder, unspecified: Secondary | ICD-10-CM

## 2020-07-10 DIAGNOSIS — F329 Major depressive disorder, single episode, unspecified: Secondary | ICD-10-CM

## 2020-07-10 LAB — WET PREP FOR TRICH, YEAST, CLUE
Trichomonas Exam: NEGATIVE
Yeast Exam: NEGATIVE

## 2020-07-10 MED ORDER — THERA VITAL M PO TABS: 1.0000 | ORAL_TABLET | Freq: Every day | ORAL | 0 refills | Status: AC

## 2020-07-10 NOTE — Progress Notes (Signed)
[facility] Department STI clinic/screening visit  Subjective:  Ruth Garner is a 24 y.o. female being seen today for  Chief Complaint  Patient presents with  . SEXUALLY TRANSMITTED DISEASE     The patient reports they do not have symptoms. Patient reports that they do desire a pregnancy in the next year. They reported they are not interested in discussing contraception today.   Patient has the following medical conditions:   Patient Active Problem List   Diagnosis Date Noted  . Current mild episode of major depressive disorder (HCC) 05/20/2019  . Thyroiditis 04/14/2017  . Near syncope 04/14/2017  . Tachycardia 04/14/2017  . Bronchitis 04/14/2017  . Autoimmune lymphocytic chronic thyroiditis 04/14/2017    HPI  Pt reports she would like STI screening. Denies symptoms. She has been trying to get pregnant for 2 months, wants to be sure she doesn't have an STI interfering with this.   See flowsheet for further details and programmatic requirements.    Patient's last menstrual period was 07/05/2020 (exact date). Last sex: this morning BCM: none Desires EC? No - desires pregnancy  Last pap per pt/review of record: never, due now Last HIV test per pt/review of record: never  Last tetanus vaccine: 01/2019   No components found for: HCV  The following portions of the patient's history were reviewed and updated as appropriate: allergies, current medications, past medical history, past social history, past surgical history and problem list.  Objective:  There were no vitals filed for this visit.   Physical Exam Vitals and nursing note reviewed.  Constitutional:      Appearance: Normal appearance.  HENT:     Head: Normocephalic and atraumatic.     Mouth/Throat:     Mouth: Mucous membranes are moist.     Pharynx: Oropharynx is clear. No oropharyngeal exudate or posterior oropharyngeal erythema.  Pulmonary:     Effort: Pulmonary effort is normal.   Abdominal:     General: Abdomen is flat.     Palpations: There is no mass.     Tenderness: There is no abdominal tenderness. There is no rebound.  Genitourinary:    General: Normal vulva.     Exam position: Lithotomy position.     Pubic Area: No rash or pubic lice.      Labia:        Right: No rash or lesion.        Left: No rash or lesion.      Vagina: Vaginal discharge (small amount, clear/white, ph>4.5) present. No erythema, bleeding or lesions.     Cervix: No cervical motion tenderness, discharge, friability, lesion or erythema.     Uterus: Normal.      Adnexa: Right adnexa normal and left adnexa normal.     Rectum: Normal.  Lymphadenopathy:     Head:     Right side of head: No preauricular or posterior auricular adenopathy.     Left side of head: No preauricular or posterior auricular adenopathy.     Cervical: No cervical adenopathy.     Upper Body:     Right upper body: No supraclavicular or axillary adenopathy.     Left upper body: No supraclavicular or axillary adenopathy.     Lower Body: No right inguinal adenopathy. No left inguinal adenopathy.  Skin:    General: Skin is warm and dry.     Findings: No rash.  Neurological:     Mental Status: She is alert and oriented to person, place, and  time.      Assessment and Plan:  Ruth Garner is a 24 y.o. female presenting to the [facility] Department for STI screening   1. Screening examination for STD (sexually transmitted disease) -Pt without symptoms. Screenings today as below. Treat wet prep per standing order. -Patient does not meet criteria for HepB, HepC Screening.  -Counseled on warning s/sx and when to seek care. Recommended condom use with all sex and discussed importance of condom use for STI prevention. -Preconception counseling given - advised taking PNV w/folic acid, encouraged healthy diet and lifestyle. - Multiple Vitamins-Minerals (MULTIVITAMIN) tablet; Take 1 tablet by mouth daily.   Dispense: 100 tablet; Refill: 0 - WET PREP FOR TRICH, YEAST, CLUE - Chlamydia/Gonorrhea [facility] - [facility] - Syphilis Serology, [facility]  2. Anxiety and depression Pt endorses anxiety and depression upon flowsheet questioning and would like to speak with behavioral health, will refer.  - Ambulatory referral to [facility]    Return for screening as needed.  No future appointments.  Ann Held, PA-C

## 2020-07-10 NOTE — Progress Notes (Signed)
In desiring STD screening tests; denies s/s Sharlette Dense, RN  [employer] prep reviewed-no Tx indicated Sharlette Dense, RN

## 2020-10-27 ENCOUNTER — Telehealth: Payer: Self-pay | Admitting: Family Medicine

## 2020-10-27 NOTE — Telephone Encounter (Signed)
Patient requesting refill of Metformin which is not on current med list.  LOV: 05/20/19.  Attempted to call patient on mobile number to schedule appointment and receive current dosage of Metformin but message received that subscriber was not in service.

## 2020-10-27 NOTE — Telephone Encounter (Signed)
Medication Refill - Medication: metformin (Patient stated that pharmacy requested medication to be sent back over a week ago and patint only has 2 pills remaining and would like request expedited.) Has the patient contacted their pharmacy? yes (Agent: If no, request that the patient contact the pharmacy for the refill.) (Agent: If yes, when and what did the pharmacy advise?)Contact PCP  Preferred Pharmacy (with phone number or street name):  [id number] - [place], [place] - [address] AT [address] Phone:  [phone]  Fax:  [phone]      Agent: Please be advised that RX refills may take up to 3 business days. We ask that you follow-up with your pharmacy.

## 2020-10-30 NOTE — Telephone Encounter (Signed)
Have not seen in 1 1/2 years- needs to be seen before we prescribe this med. I am not sure why she is taking metformin

## 2020-11-08 NOTE — Telephone Encounter (Signed)
Pt called back and did not know why Dr Yetta Barre had not filled her metformin. Advised pt since she had not been seen in 1 1/2 yrs, Dr Yetta Barre cannot fill this med. Offered pt appt, and she said she doesn't live near here anymore.  Pt states she will find her a dr nearer to her home.

## 2020-11-13 IMAGING — US US THYROID
1 series · 13 of 25 positions shown · non-contrast
Comparison: 05/28/2011

CLINICAL DATA: Thyromegaly on exam

EXAM:
THYROID ULTRASOUND
TECHNIQUE: Ultrasound examination of the thyroid gland and adjacent soft
tissues was performed.

[Series 1: us thyroid · 13 of 52 slices shown]
[im 1/52]
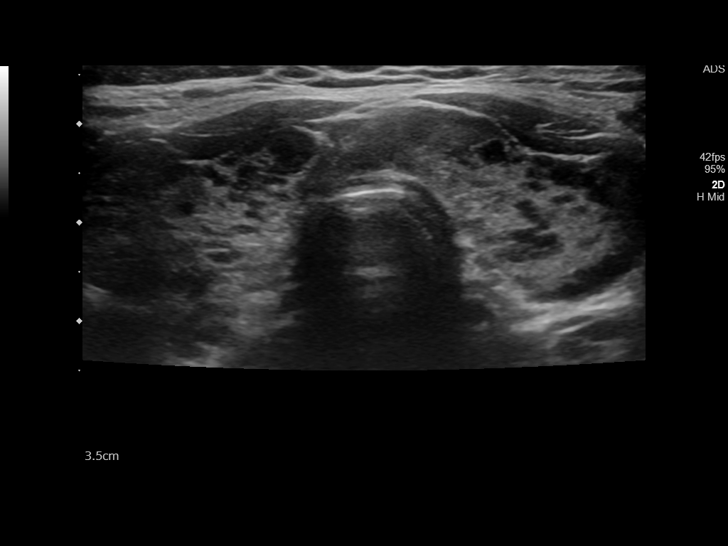
[im 5/52]
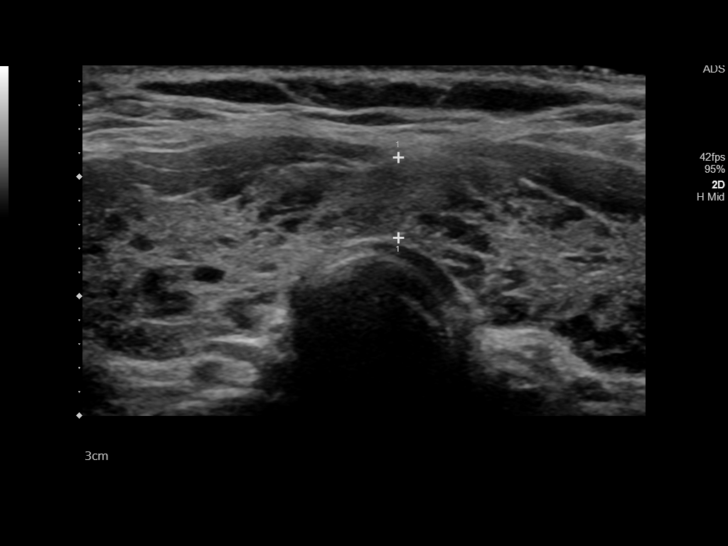
[im 9/52]
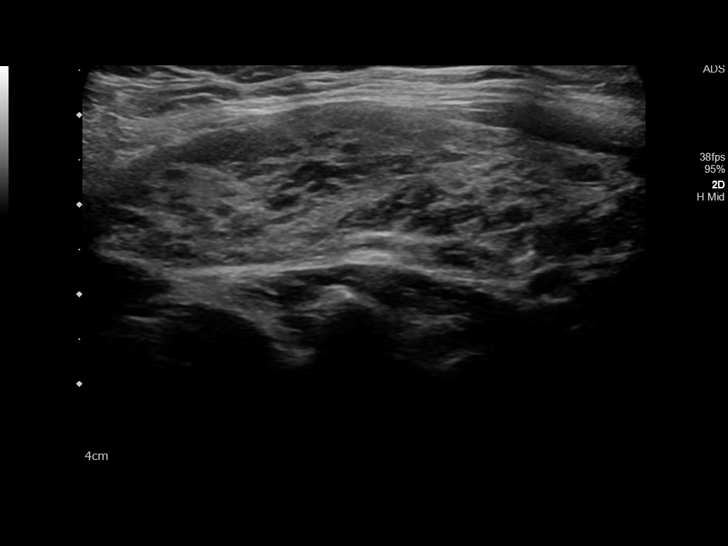
[im 13/52]
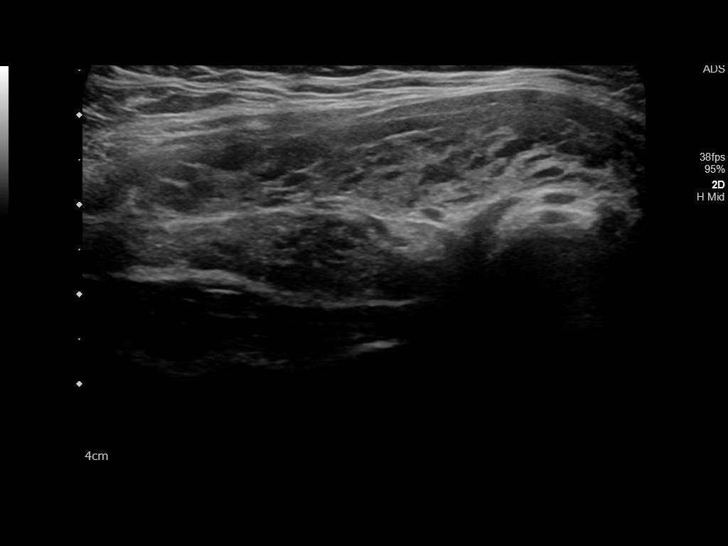
[im 18/52]
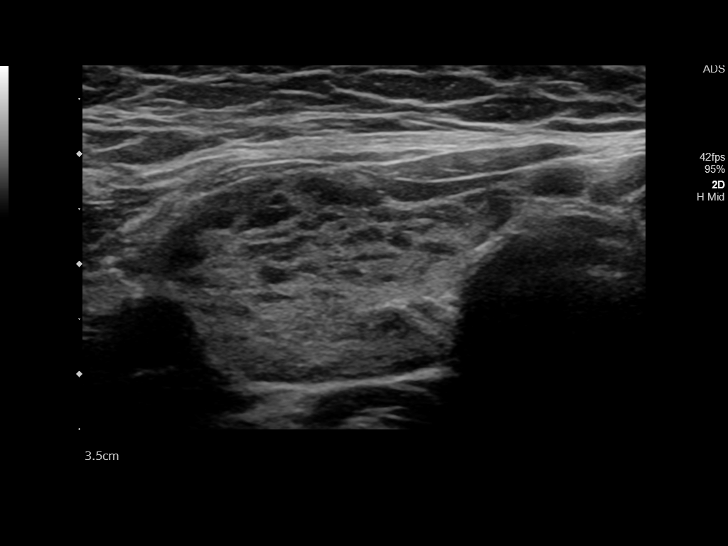
[im 22/52]
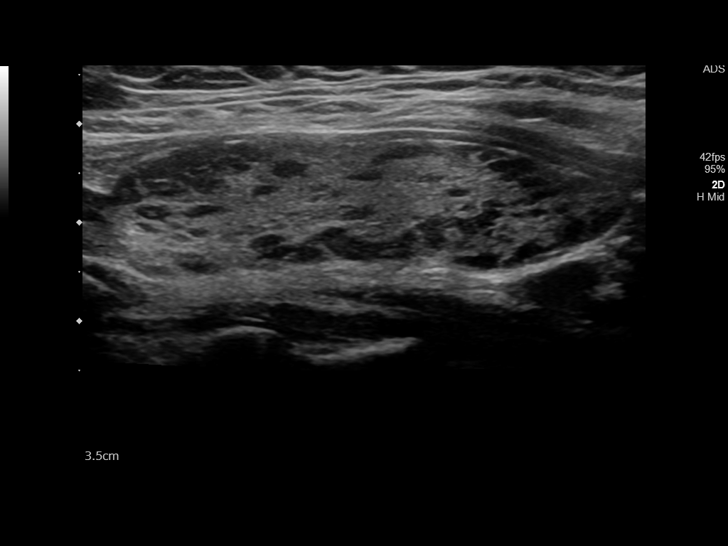
[im 26/52]
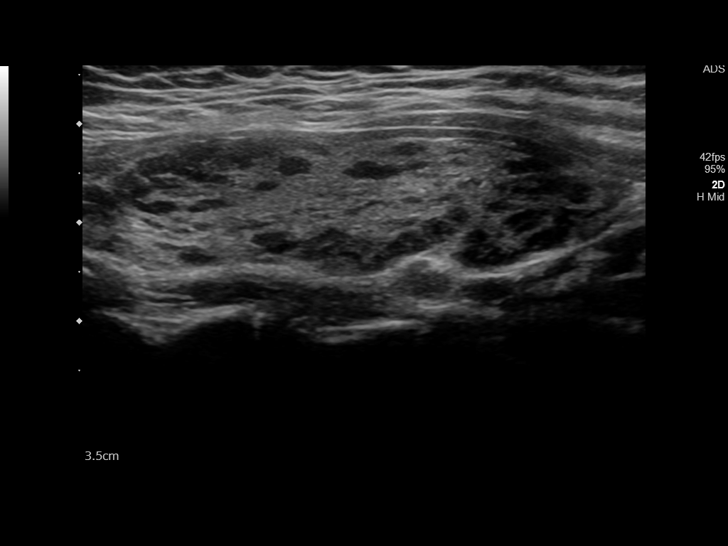
[im 30/52]
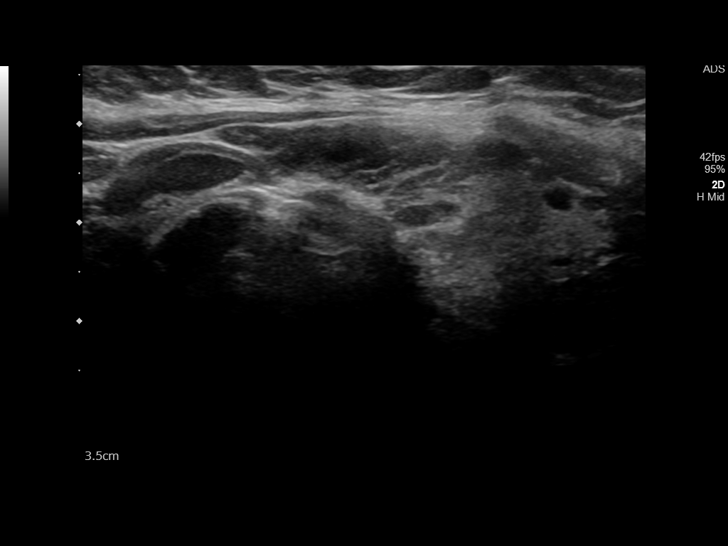
[im 35/52]
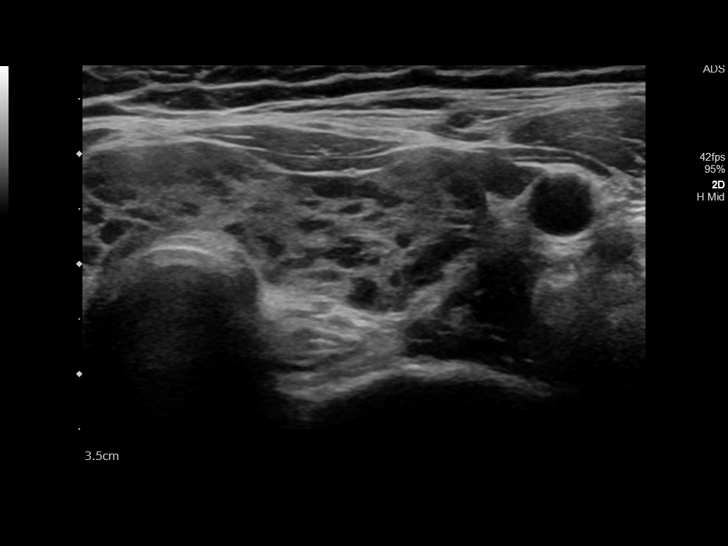
[im 39/52]
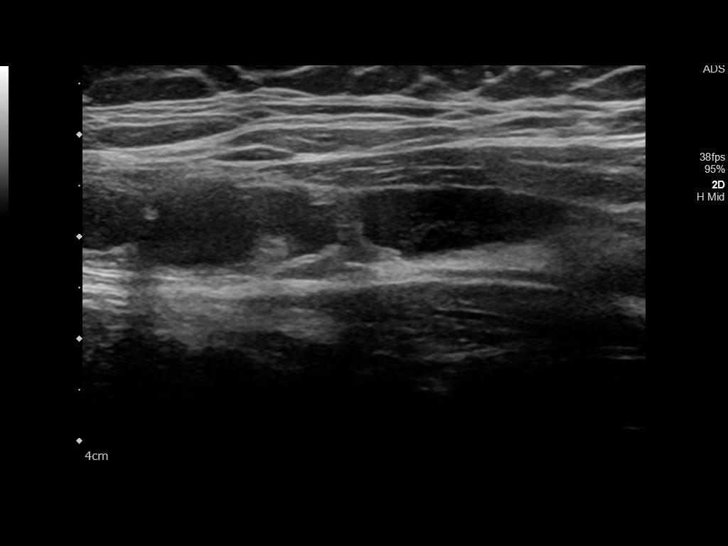
[im 43/52]
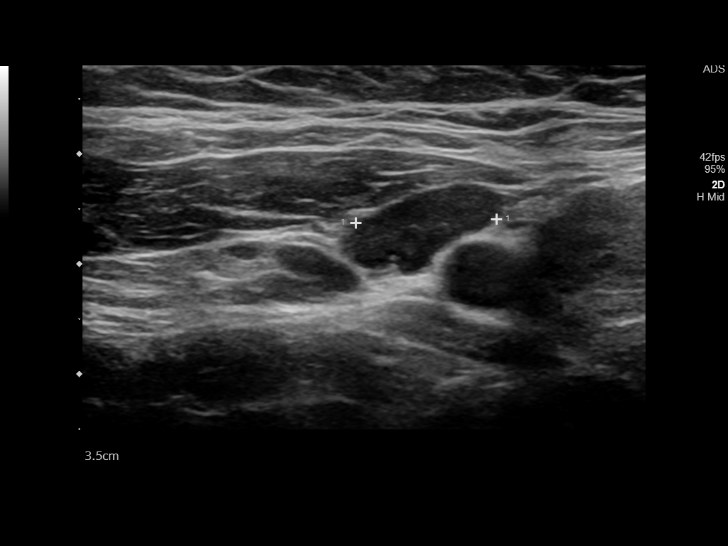
[im 47/52]
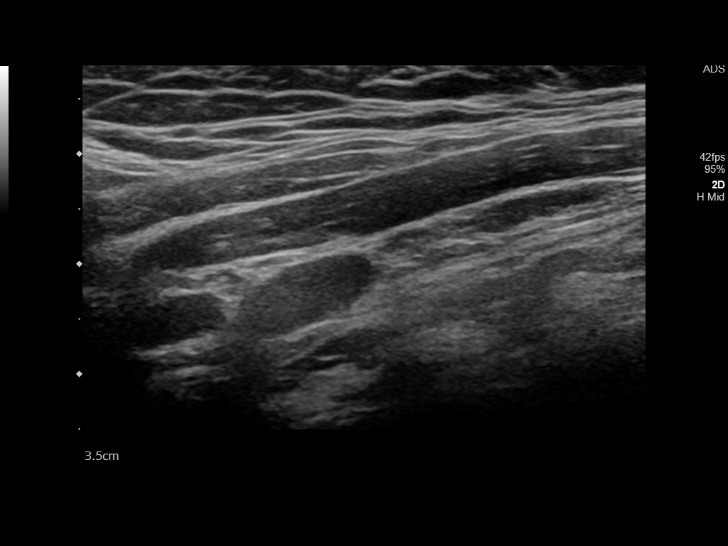
[im 52/52]
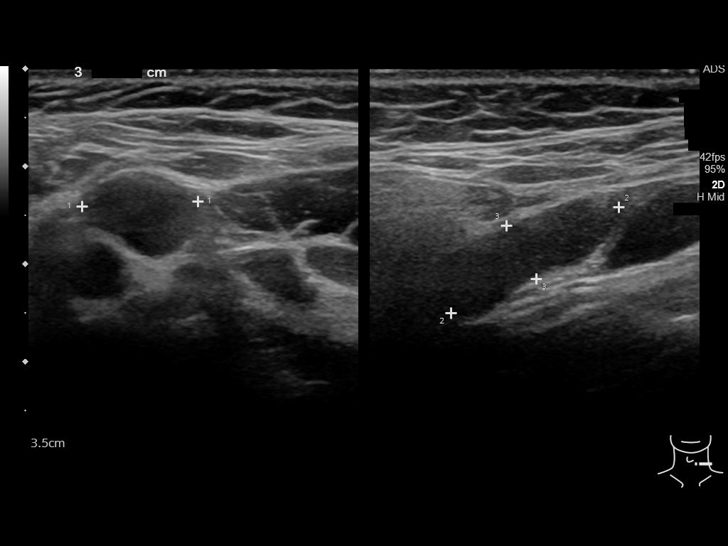

[13 of 25 positions shown; findings below may reference images not displayed]

FINDINGS: Parenchymal Echotexture: Markedly heterogenous

Isthmus: 7 mm, previously 8 mm

Right lobe: 6.2 x 2.9 x 1.7 cm, previously 6.3 x 2.0 x 1.5 cm

Left lobe: 5.3 x 2.4 x 1.3 cm, previously 5.7 x 1.6 x 2.0 cm

_________________________________________________________

Estimated total number of nodules >/= 1 cm: 0

Number of spongiform nodules >/=  2 cm not described below (TR1): 0

Number of mixed cystic and solid nodules >/= 1.5 cm not described
below (TR2): 0

_________________________________________________________

Marked gland heterogeneity and mixed echogenicity with diffuse
septation and pseudo nodularity. No significant discrete nodule or
focal abnormality. No hypervascularity. Suspect sequelae from prior
chronic thyroiditis.

Prominent lymph nodes bilaterally with short axis measurements only
6 mm. These remain nonspecific.
IMPRESSION: Chronic gland mild enlargement and heterogeneity, suspect sequelae
from prior thyroiditis. No significant focal abnormality or discrete
nodule.

Nonspecific borderline cervical adenopathy.

The above is in keeping with the ACR TI-RADS recommendations - [HOSPITAL] 5281;[DATE].

## 2020-11-29 ENCOUNTER — Encounter: Payer: Self-pay | Admitting: Family Medicine

## 2021-02-13 IMAGING — CR CHEST - 2 VIEW
1 series · 2 of 2 positions shown · non-contrast
Comparison: 07/04/2017

CLINICAL DATA: Chest pain.

EXAM:
CHEST - 2 VIEW

[Series 1: dg chest 2 view · 0.14mm/px · 2 of 2 slices shown]
[im 1/2]
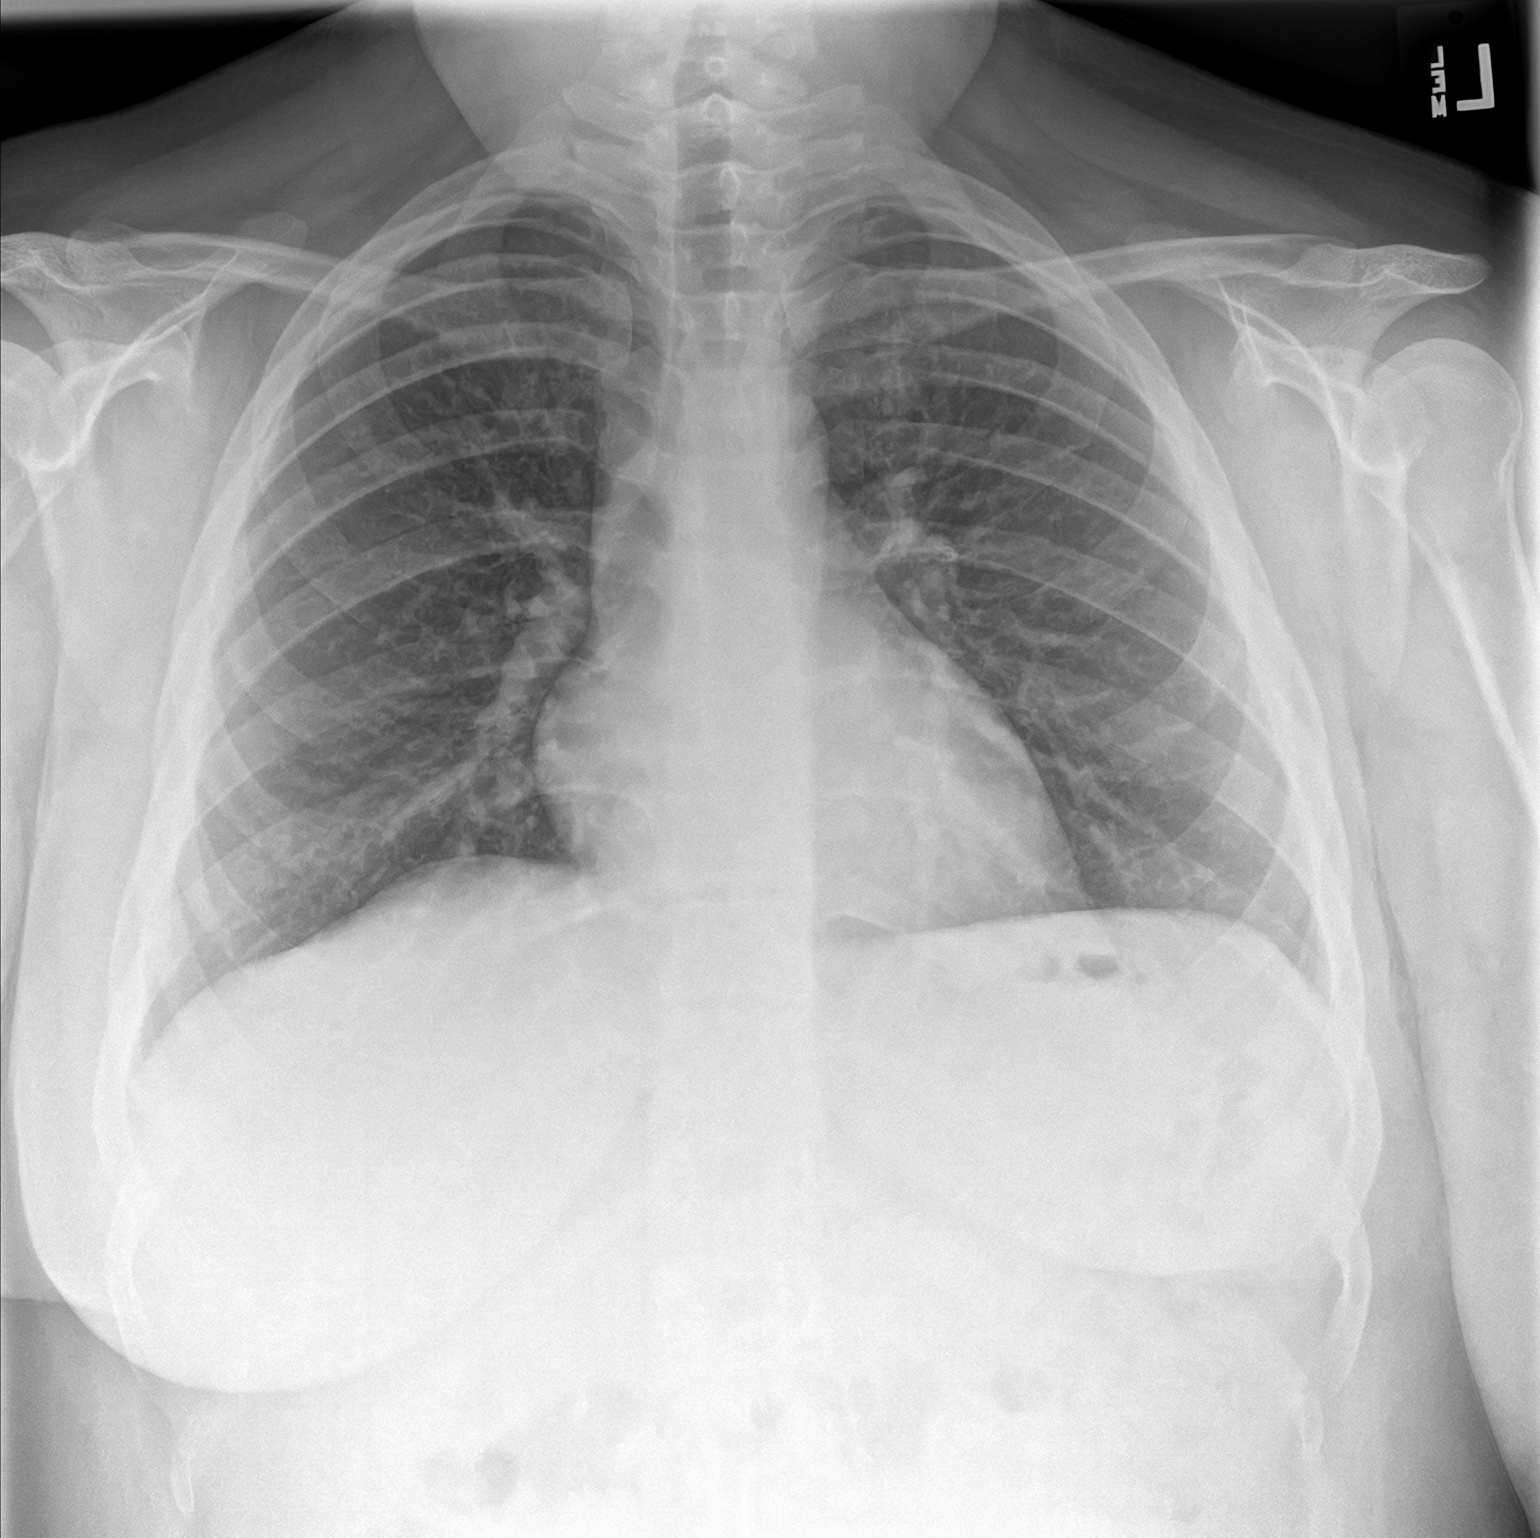
[im 2/2]
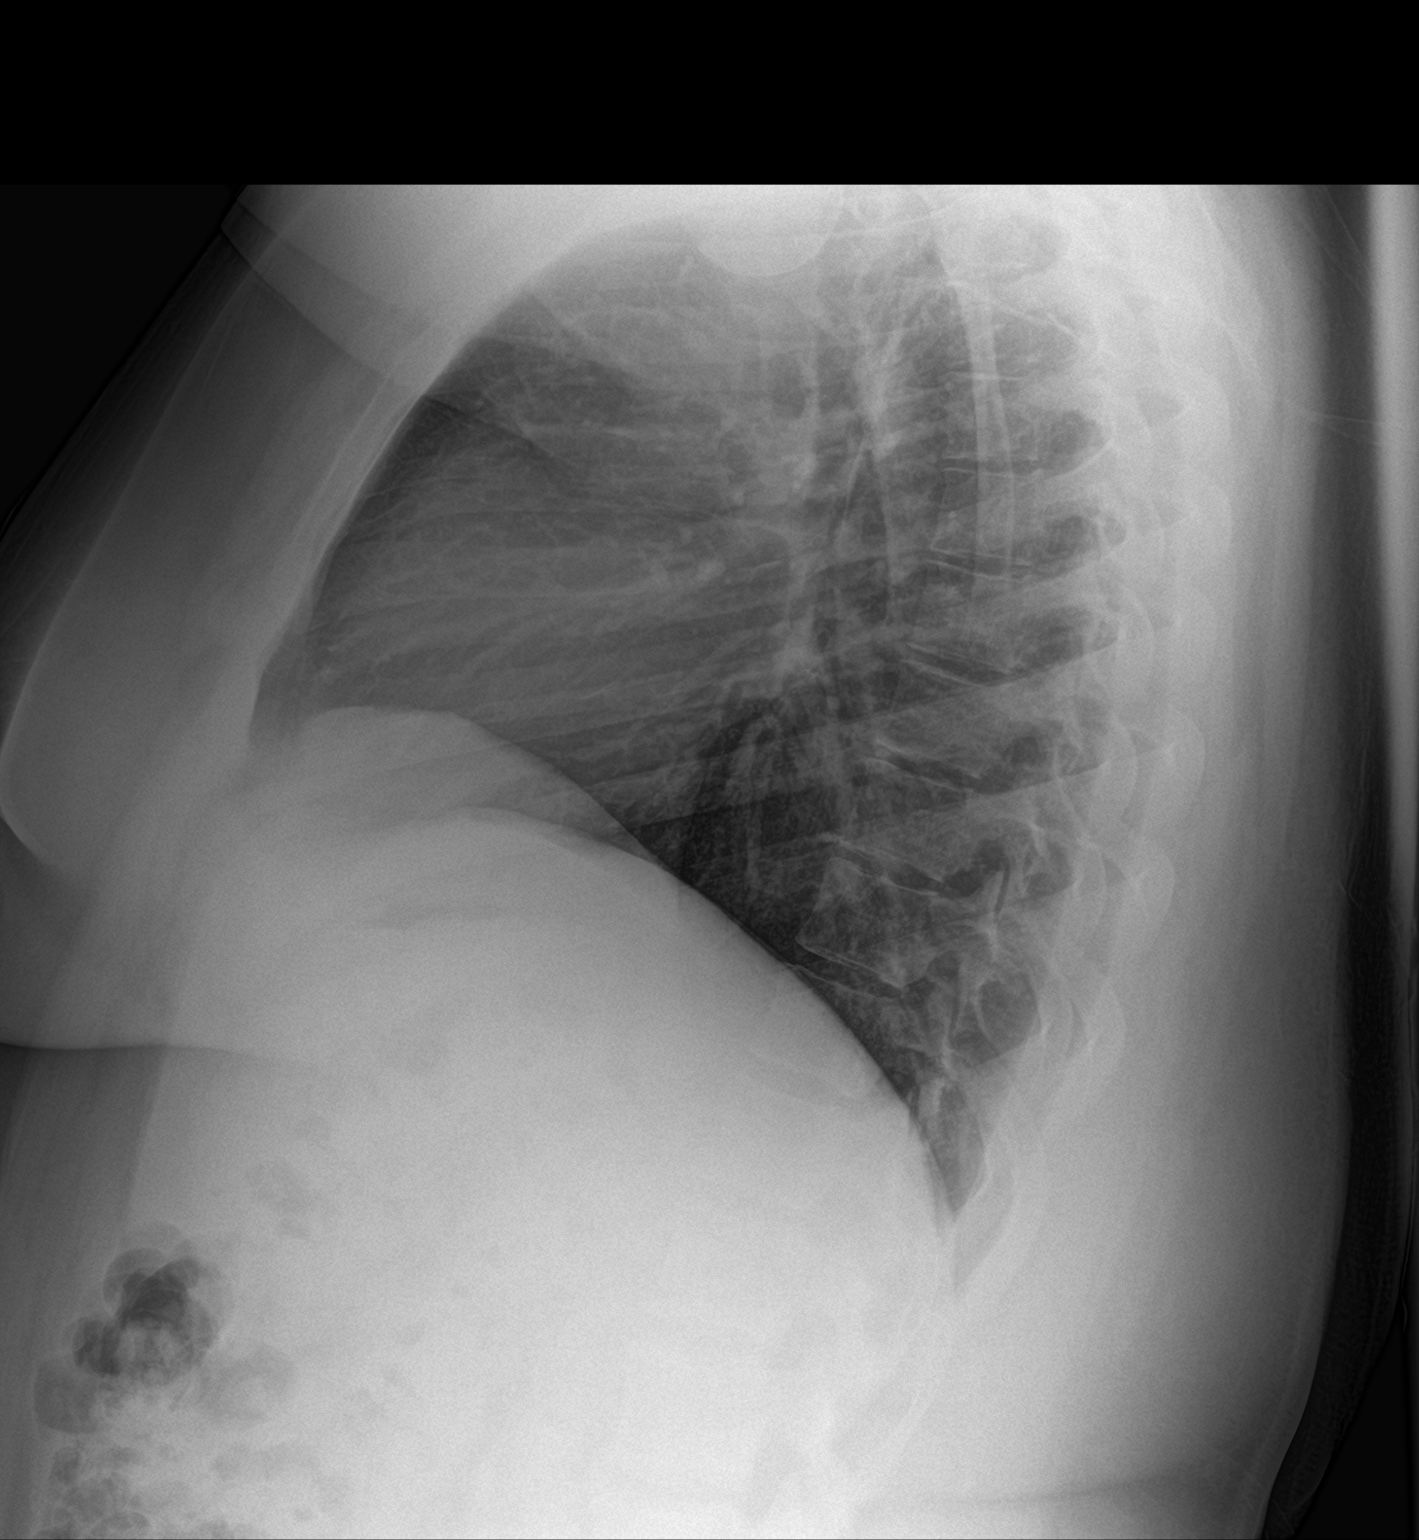

[2 of 2 positions shown; findings below may reference images not displayed]

FINDINGS: The heart size and mediastinal contours are within normal limits.
Both lungs are clear. The visualized skeletal structures are
unremarkable.
IMPRESSION: No active cardiopulmonary disease.

## 2023-10-06 DIAGNOSIS — E039 Hypothyroidism, unspecified: Secondary | ICD-10-CM | POA: Insufficient documentation

## 2023-11-16 ENCOUNTER — Ambulatory Visit
Admission: EM | Admit: 2023-11-16 | Discharge: 2023-11-16 | Disposition: A | Discharge status: Home / Self Care | Payer: Medicaid Other | Attending: Emergency Medicine | Admitting: Emergency Medicine

## 2023-11-16 ENCOUNTER — Encounter: Payer: Self-pay | Admitting: Emergency Medicine

## 2023-11-16 DIAGNOSIS — B3731 Acute candidiasis of vulva and vagina: Secondary | ICD-10-CM | POA: Insufficient documentation

## 2023-11-16 LAB — URINALYSIS, W/ REFLEX TO CULTURE (INFECTION SUSPECTED)
Bilirubin Urine: NEGATIVE
Glucose, UA: NEGATIVE mg/dL
Nitrite: NEGATIVE
Protein, ur: NEGATIVE mg/dL
Specific Gravity, Urine: 1.02 (ref 1.005–1.030)
pH: 7 (ref 5.0–8.0)

## 2023-11-16 LAB — WET PREP, GENITAL
Clue Cells Wet Prep HPF POC: NONE SEEN
Sperm: NONE SEEN
Trich, Wet Prep: NONE SEEN
WBC, Wet Prep HPF POC: >10 — AB (ref <10)

## 2023-11-16 MED ORDER — FLUCONAZOLE 150 MG PO TABS: 150.0000 mg | ORAL_TABLET | ORAL | 0 refills | Status: AC

## 2023-11-16 NOTE — ED Triage Notes (Signed)
Patient reports using a different tampon recently.  Patient reports vaginal discharge, vaginal itching and vaginal irritation for 3-4 days.

## 2023-11-16 NOTE — Discharge Instructions (Addendum)
Your testing shows that you have a vaginal yeast infection.  Please use the Diflucan tablets to treat your yeast infection.  Take 1 tablet now and repeat dosing every 3 days for a total of 3 doses.

## 2023-11-16 NOTE — ED Provider Notes (Signed)
[facility]    CSN: [id number] Arrival date & time: 11/16/23  1112      History   Chief Complaint Chief Complaint  Patient presents with   Vaginal Discharge   Vaginal Itching    HPI Ruth Garner is a 27 y.o. female.   HPI  27 year old female with a past medical history significant for anxiety the, heart murmur, and autoimmune lymphocytic chronic thyroiditis presents for evaluation of vaginal itching with white vaginal discharge that started 3 to 4 days ago.  This is associated with some pain with urination along with urinary urgency or frequency.  Patient denies any odor to the discharge and she has not seen the blood in her urine.  She also denies any low back or abdominal pain.  Past Medical History:  Diagnosis Date   Anxiety    Heart murmur     Patient Active Problem List   Diagnosis Date Noted   Current mild episode of major depressive disorder (HCC) 05/20/2019   Thyroiditis 04/14/2017   Near syncope 04/14/2017   Tachycardia 04/14/2017   Bronchitis 04/14/2017   Autoimmune lymphocytic chronic thyroiditis 04/14/2017    Past Surgical History:  Procedure Laterality Date   CESAREAN SECTION      OB History     Gravida  0   Para  0   Term  0   Preterm  0   AB  0   Living  0      SAB  0   IAB  0   Ectopic  0   Multiple  0   Live Births  0            Home Medications    Prior to Admission medications   Medication Sig Start Date End Date Taking? Authorizing Provider  fluconazole (DIFLUCAN) 150 MG tablet Take 1 tablet (150 mg total) by mouth every 3 (three) days for 3 doses. 11/16/23 11/23/23 Yes Becky Augusta, NP  norelgestromin-ethinyl estradiol [facility] Medico) 150-35 MCG/24HR transdermal patch Place onto the skin. 10/01/22  Yes [provider]  TRULICITY 4.5 MG/0.5ML SOAJ Inject into the skin. 10/15/23 10/14/24 Yes [provider]    Family History Family History  Problem Relation Age of Onset   Diabetes  Mother    Hypertension Father     Social History Social History   Tobacco Use   Smoking status: Former    Current packs/day: 0.50    Types: Cigarettes   Smokeless tobacco: Never  Vaping Use   Vaping status: Some Days  Substance Use Topics   Alcohol use: No    Alcohol/week: 0.0 standard drinks of alcohol   Drug use: No     Allergies   Metoprolol   Review of Systems Review of Systems  Constitutional:  Negative for fever.  Gastrointestinal:  Negative for abdominal pain.  Genitourinary:  Positive for dysuria, frequency, urgency, vaginal discharge and vaginal pain. Negative for hematuria.  Musculoskeletal:  Negative for back pain.     Physical Exam Triage Vital Signs ED Triage Vitals  Encounter Vitals Group     BP 11/16/23 1205 124/83     Systolic BP Percentile --      Diastolic BP Percentile --      Pulse Rate 11/16/23 1205 77     Resp 11/16/23 1205 14     Temp 11/16/23 1205 97.9 F (36.6 C)     Temp Source 11/16/23 1205 Oral     SpO2 11/16/23 1205 97 %  Weight 11/16/23 1201 245 lb (111.1 kg)     Height 11/16/23 1201 5\' 10"  (1.778 m)     Head Circumference --      Peak Flow --      Pain Score 11/16/23 1201 4     Pain Loc --      Pain Education --      Exclude from Growth Chart --    No data found.  Updated Vital Signs BP 124/83 (BP Location: Right Arm)   Pulse 77   Temp 97.9 F (36.6 C) (Oral)   Resp 14   Ht 5\' 10"  (1.778 m)   Wt 245 lb (111.1 kg)   LMP 11/08/2023 (Approximate)   SpO2 97%   BMI 35.15 kg/m   Visual Acuity Right Eye Distance:   Left Eye Distance:   Bilateral Distance:    Right Eye Near:   Left Eye Near:    Bilateral Near:     Physical Exam Vitals and nursing note reviewed.  Constitutional:      Appearance: Normal appearance. She is not ill-appearing.  HENT:     Head: Normocephalic and atraumatic.  Cardiovascular:     Rate and Rhythm: Normal rate and regular rhythm.     Pulses: Normal pulses.     Heart sounds:  Normal heart sounds. No murmur heard.    No friction rub. No gallop.  Pulmonary:     Effort: Pulmonary effort is normal.     Breath sounds: Normal breath sounds. No wheezing, rhonchi or rales.  Abdominal:     Tenderness: There is no right CVA tenderness or left CVA tenderness.  Skin:    General: Skin is warm and dry.     Capillary Refill: Capillary refill takes less than 2 seconds.     Findings: No rash.  Neurological:     General: No focal deficit present.     Mental Status: She is alert and oriented to person, place, and time.      UC Treatments / Results  Labs (all labs ordered are listed, but only abnormal results are displayed) Labs Reviewed  WET PREP, GENITAL - Abnormal; Notable for the following components:      Result Value   Yeast Wet Prep HPF POC PRESENT (*)    WBC, Wet Prep HPF POC >10 (*)    All other components within normal limits  URINALYSIS, W/ REFLEX TO CULTURE (INFECTION SUSPECTED) - Abnormal; Notable for the following components:   APPearance HAZY (*)    Hgb urine dipstick SMALL (*)    Ketones, ur TRACE (*)    Leukocytes,Ua MODERATE (*)    Bacteria, UA FEW (*)    All other components within normal limits    EKG   Radiology No results found.  Procedures Procedures (including critical care time)  Medications Ordered in UC Medications - No data to display  Initial Impression / Assessment and Plan / UC Course  I have reviewed the triage vital signs and the nursing notes.  Pertinent labs & imaging results that were available during my care of the patient were reviewed by me and considered in my medical decision making (see chart for details).   Patient is a nontoxic-appearing 27 year old female presenting for evaluation of genitourinary complaints as outlined HPI above.  Physical exam reveals a benign cardiopulmonary exam and no CVA tenderness on exam.  Due to having dysuria as well as vaginal discharge we will order urinalysis to look for the  presence of UTI and vaginal  wet prep to evaluate for BV or vaginal yeast infection.  Urinalysis shows a hazy appearance with small hemoglobin, trace ketones, and moderate leukocyte esterase.  Negative for nitrites or protein.  Reflex microscopy shows significant skin cell contamination but 0-5 WBCs and 0-5 RBCs.  Budding yeast is present.  Wet prep is positive for yeast.  I will discharge patient home with a diagnosis of yeast vaginitis and started on Diflucan 150 mg tablets, 1 tablet now and repeat dosing every 3 days for total of 3 doses.   Final Clinical Impressions(s) / UC Diagnoses   Final diagnoses:  Vaginal yeast infection     Discharge Instructions      Your testing shows that you have a vaginal yeast infection.  Please use the Diflucan tablets to treat your yeast infection.  Take 1 tablet now and repeat dosing every 3 days for a total of 3 doses.     ED Prescriptions     Medication Sig Dispense Auth. Provider   fluconazole (DIFLUCAN) 150 MG tablet Take 1 tablet (150 mg total) by mouth every 3 (three) days for 3 doses. 3 tablet Becky Augusta, NP      PDMP not reviewed this encounter.   Becky Augusta, NP 11/16/23 1229

## 2024-04-15 ENCOUNTER — Encounter: Payer: Self-pay | Admitting: Family Medicine

## 2024-04-15 ENCOUNTER — Ambulatory Visit: Admitting: Family Medicine

## 2024-04-15 VITALS — BP 109/76 | HR 107 | Temp 97.9°F | Resp 16 | Ht 68.11 in | Wt 199.0 lb

## 2024-04-15 DIAGNOSIS — Z7689 Persons encountering health services in other specified circumstances: Secondary | ICD-10-CM

## 2024-04-15 DIAGNOSIS — E039 Hypothyroidism, unspecified: Secondary | ICD-10-CM

## 2024-04-15 DIAGNOSIS — Z6830 Body mass index (BMI) 30.0-30.9, adult: Secondary | ICD-10-CM

## 2024-04-15 DIAGNOSIS — Z0000 Encounter for general adult medical examination without abnormal findings: Secondary | ICD-10-CM

## 2024-04-15 DIAGNOSIS — E6609 Other obesity due to excess calories: Secondary | ICD-10-CM | POA: Diagnosis not present

## 2024-04-15 DIAGNOSIS — E66811 Obesity, class 1: Secondary | ICD-10-CM

## 2024-04-15 DIAGNOSIS — E119 Type 2 diabetes mellitus without complications: Secondary | ICD-10-CM

## 2024-04-15 NOTE — Progress Notes (Signed)
 New Patient Office Visit  Subjective   Patient ID: Ruth Garner, female    DOB: 1996-02-24  Age: 28 y.o. MRN: [id number]  CC:  Chief Complaint  Patient presents with   Establish Care    Pt. Here to establish care.   Diabetes    Pt. Had some concerns about diabetes and cholesterol.    HPI Ruth Garner is a 28 year old female who presents to establish with [facility] at [facility].   CC: Patient here to establish care  Last PCP: [facility]  Specialists: none   Patient presents today with concerns about her diabetes.  DM2: taking Trulicity 4.5mg  weekly  Oct 2024- A1c last checked- 8.2% Diet- she would often drink protein shake for the day and nothing else  Denies being seen by endocrinology in the past Reports numbness and tingling of her hands and feet. States that her extremities will also change color to reddish/purple and become cold. Happens more often when she sits with her legs crossed or sits for a prolonged period of time.      04/15/2024    3:26 PM 05/20/2019    3:37 PM  GAD 7 : Generalized Anxiety Score  Nervous, Anxious, on Edge 1 1  Control/stop worrying 0 1  Worry too much - different things 1 1  Trouble relaxing 2 1  Restless 1 1  Easily annoyed or irritable 3 3  Afraid - awful might happen 0 0  Total GAD 7 Score 8 8  Anxiety Difficulty Somewhat difficult Somewhat difficult       04/15/2024    3:25 PM 05/20/2019    3:35 PM 01/27/2018    8:56 AM  PHQ9 SCORE ONLY  PHQ-9 Total Score 15 16 1     Outpatient Encounter Medications as of 04/15/2024  Medication Sig   norelgestromin-ethinyl estradiol (XULANE) 150-35 MCG/24HR transdermal patch Place onto the skin.   phentermine 15 MG capsule Take 15 mg by mouth daily.   TRULICITY 4.5 MG/0.5ML SOAJ Inject into the skin.   No facility-administered encounter medications on file as of 04/15/2024.    Patient Active Problem List   Diagnosis Date Noted   Acquired hypothyroidism 10/06/2023   Current mild  episode of major depressive disorder (HCC) 05/20/2019   Thyroiditis 04/14/2017   Near syncope 04/14/2017   Tachycardia 04/14/2017   Bronchitis 04/14/2017   Autoimmune lymphocytic chronic thyroiditis 04/14/2017   Past Medical History:  Diagnosis Date   Anxiety    Heart murmur    Past Surgical History:  Procedure Laterality Date   CESAREAN SECTION     Family History  Problem Relation Age of Onset   Diabetes Mother    Hypertension Father    Social History   Socioeconomic History   Marital status: Single    Spouse name: Not on file   Number of children: Not on file   Years of education: Not on file   Highest education level: Not on file  Occupational History   Not on file  Tobacco Use   Smoking status: Former    Current packs/day: 0.50    Types: Cigarettes   Smokeless tobacco: Never  Vaping Use   Vaping status: Some Days  Substance and Sexual Activity   Alcohol use: No    Alcohol/week: 0.0 standard drinks of alcohol   Drug use: No   Sexual activity: Yes    Partners: Male    Birth control/protection: Patch  Other Topics Concern   Not on file  Social History Narrative   Not on file   Social Drivers of Health   Financial Resource Strain: Low Risk  (09/03/2022)   Received from [facility], [facility] visits prior to 02/15/2023.   Overall Financial Resource Strain (CARDIA)    Difficulty of Paying Living Expenses: Not very hard  Food Insecurity: No Food Insecurity (09/03/2022)   Received from [facility], [facility] visits prior to 02/15/2023.   Hunger Vital Sign    Worried About Running Out of Food in the Last Year: Never true    Ran Out of Food in the Last Year: Never true  Transportation Needs: No Transportation Needs (09/03/2022)   [facility], [facility] visits prior to 02/15/2023.   PRAPARE - Administrator, Civil Service (Medical): No    Lack of Transportation  (Non-Medical): No  Physical Activity: Not on file  Stress: Not on file  Social Connections: Unknown (09/03/2022)   Received from [facility], [facility] visits prior to 02/15/2023.   Social Connection and Isolation Panel [NHANES]    Frequency of Communication with Friends and Family: More than three times a week    Frequency of Social Gatherings with Friends and Family: More than three times a week    Attends Religious Services: Not on file    Active Member of Clubs or Organizations: Not on file    Attends Banker Meetings: Not on file    Marital Status: Not on file  Intimate Partner Violence: Not At Risk (09/03/2022)   Received from [facility] visits prior to 02/15/2023.   Humiliation, Afraid, Rape, and Kick questionnaire    Fear of Current or Ex-Partner: No    Emotionally Abused: No    Physically Abused: No    Sexually Abused: No   Outpatient Medications Prior to Visit  Medication Sig Dispense Refill   norelgestromin-ethinyl estradiol (XULANE) 150-35 MCG/24HR transdermal patch Place onto the skin.     phentermine 15 MG capsule Take 15 mg by mouth daily.     TRULICITY 4.5 MG/0.5ML SOAJ Inject into the skin.     No facility-administered medications prior to visit.   Allergies  Allergen Reactions   Metoprolol  Other (See Comments)    Makes me what to kill myself   ROS: see HPI    Objective   Today's Vitals   04/15/24 1521  BP: 109/76  Pulse: (!) 107  Resp: 16  Temp: 97.9 F (36.6 C)  TempSrc: Oral  SpO2: 94%  Weight: 199 lb (90.3 kg)  Height: 5' 8.11" (1.73 m)  PainSc: 0-No pain   GENERAL: Well-appearing, in NAD. Well nourished.  SKIN: Pink, warm and dry. No rash, lesion, ulceration, or ecchymoses.  Head: Normocephalic. NECK: Trachea midline. Full ROM w/o pain or tenderness. No lymphadenopathy.  EARS: Tympanic membranes are intact, translucent without bulging and without drainage. Appropriate landmarks  visualized.  EYES: Conjunctiva clear without exudates. EOMI, PERRL, no drainage present.  NOSE: Septum midline w/o deformity. Nares patent, mucosa pink and non-inflamed w/o drainage. No sinus tenderness.  THROAT: Uvula midline. Oropharynx clear. Tonsils non-inflamed without exudate. Mucous membranes pink and moist.  RESPIRATORY: Chest wall symmetrical. Respirations even and non-labored. Breath sounds clear to auscultation bilaterally.  CARDIAC: S1, S2 present, regular rate and rhythm without murmur or gallops. Peripheral pulses 2+ bilaterally.  MSK: Muscle tone and strength appropriate for age. Joints w/o tenderness, redness, or swelling.  EXTREMITIES: Without clubbing, cyanosis, or edema.  NEUROLOGIC: No motor or sensory deficits. Steady, even gait. C2-C12 intact.  PSYCH/MENTAL STATUS: Alert, oriented x 3. Cooperative, appropriate mood and affect.   Physical Exam Feet:     Right foot:     Protective Sensation: 10 sites tested.  10 sites sensed.     Left foot:     Protective Sensation: 10 sites tested.  10 sites sensed.        Assessment & Plan:   1. Encounter to establish care (Primary) Patient is a 59- year-old female who presents today to establish care with primary care at [facility]. Reviewed the past medical history, family history, social history, surgical history, medications and allergies today- updates made as indicated. Patient has concerns today about her diabetes.    2. Diabetes mellitus without complication (HCC) Last hemoglobin A1c performed with results of 8.2% on 10/06/23. Currently on Trulicity, with 25 pound weight loss per chart review. Discussed lifestyle modifications. Foot exam performed with no noted peripheral neuropathy or open wounds present. Normal 2+ DP and PT pulses. No lower extremity edema. Will repeat hemoglobin A1c and fasting lipid panel.  - Hemoglobin A1c; Future - Lipid panel; Future  3. Class 1 obesity due to excess calories without serious  comorbidity with body mass index (BMI) of 30.0 to 30.9 in adult BMI 30.16. Discussed importance of lifestyle modifications- including healthy diet and exercise.  - Hemoglobin A1c; Future - Lipid panel; Future  4. Acquired hypothyroidism Review of chart- patient was diagnosed with hypothyroidism in the past and was prescribed levothyroxine. She is not on medication at this time. Will assess thyroid  function.  - TSH Rfx on Abnormal to Free T4; Future  5. Wellness examination Plan to complete fasting labs in near future prior to CPE.  - CBC with Differential/Platelet; Future - Comprehensive metabolic panel with GFR; Future - Hemoglobin A1c; Future - Lipid panel; Future - TSH Rfx on Abnormal to Free T4; Future   Return in about 4 weeks (around 05/13/2024) for Diabetes f/u.   Wilhelmena Hanson, FNP

## 2024-04-15 NOTE — Patient Instructions (Signed)
 Please come in for fasting labs (nothing to eat after midnight), may drink water and have plain coffee in the morning.   MyChart:  For all urgent or time sensitive needs we ask that you please call the office to avoid delays. Our number is [phone]. MyChart is not constantly monitored and due to the large volume of messages a day, replies may take up to 72 business hours.   MyChart Policy: MyChart allows for you to see your visit notes, after visit summary, provider recommendations, lab and tests results, make an appointment, request refills, and contact your provider or the office for non-urgent questions or concerns. Providers are seeing patients during normal business hours and do not have built in time to review MyChart messages.  We ask that you allow a minimum of 3 business days for responses to [employer]. For this reason, please do not send urgent requests through MyChart. Please call the office at [phone]. New and ongoing conditions may require a visit. We have virtual and in person visit available for your convenience.  Complex MyChart concerns may require a visit. Your provider may request you schedule a virtual or in person visit to ensure we are providing the best care possible. MyChart messages sent after 11:00 AM on Friday will not be received by the provider until Monday morning.    Lab and Test Results: You will receive your lab and test results on MyChart as soon as they are completed and results have been sent by the lab or testing facility. Due to this service, you will receive your results BEFORE your provider.  I review lab and tests results each morning prior to seeing patients. Some results require collaboration with other providers to ensure you are receiving the most appropriate care. For this reason, we ask that you please allow a minimum of 3-5 business days from the time the ALL results have been received for your provider to receive and review lab and  test results and contact you about these.  Most lab and test result comments from the provider will be sent through MyChart. Your provider may recommend changes to the plan of care, follow-up visits, repeat testing, ask questions, or request an office visit to discuss these results. You may reply directly to this message or call the office at [phone] to provide information for the provider or set up an appointment. In some instances, you will be called with test results and recommendations. Please let us  know if this is preferred and we will make note of this in your chart to provide this for you.    If you have not heard a response to your lab or test results in 5 business days from all results returning to MyChart, please call the office to let us  know. We ask that you please avoid calling prior to this time unless there is an emergent concern. Due to high call volumes, this can delay the resulting process.   After Hours: For all non-emergency after hours needs, please call the office at [phone] and select the option to reach the on-call provider service. On-call services are shared between multiple [facility] offices and therefore it will not be possible to speak directly with your provider. On-call providers may provide medical advice and recommendations, but are unable to provide refills for maintenance medications.  For all emergency or urgent medical needs after normal business hours, we recommend that you seek care at the closest Urgent Care or Emergency Department to ensure appropriate  treatment in a timely manner.  [facility] [place] has a 24 hour emergency room located on the ground floor for your convenience.    Urgent Concerns During the Business Day Providers are seeing patients from 8AM to 5PM, Monday through Thursday, and 8AM to 12PM on Friday with a busy schedule and are most often not able to respond to non-urgent calls until the end of the day or the next  business day. If you should have URGENT concerns during the day, please call and speak to the nurse or schedule a same day appointment so that we can address your concern without delay.    Thank you, again, for choosing me as your health care partner. I appreciate your trust and look forward to learning more about you.    Wilhelmena Hanson, FNP-C

## 2024-05-13 ENCOUNTER — Ambulatory Visit: Admitting: Family Medicine

## 2024-05-13 ENCOUNTER — Encounter: Payer: Self-pay | Admitting: Family Medicine

## 2024-05-13 VITALS — BP 102/71 | HR 87 | Temp 97.9°F | Resp 18 | Ht 68.11 in | Wt 199.0 lb

## 2024-05-13 DIAGNOSIS — F331 Major depressive disorder, recurrent, moderate: Secondary | ICD-10-CM

## 2024-05-13 DIAGNOSIS — F411 Generalized anxiety disorder: Secondary | ICD-10-CM | POA: Diagnosis not present

## 2024-05-13 DIAGNOSIS — E119 Type 2 diabetes mellitus without complications: Secondary | ICD-10-CM | POA: Diagnosis not present

## 2024-05-13 LAB — POCT GLYCOSYLATED HEMOGLOBIN (HGB A1C): Hemoglobin A1C: 5.4 % (ref 4.0–5.6)

## 2024-05-13 MED ORDER — ESCITALOPRAM OXALATE 5 MG PO TABS: 5.0000 mg | ORAL_TABLET | Freq: Every day | ORAL | 2 refills | Status: DC

## 2024-05-13 MED ORDER — TRULICITY 4.5 MG/0.5ML ~~LOC~~ SOAJ: 4.5000 mg | SUBCUTANEOUS | 11 refills | Status: DC

## 2024-05-13 NOTE — Progress Notes (Unsigned)
 Established Patient Office Visit  Subjective  Patient ID: Ruth Garner, female    DOB: 12/21/1995  Age: 28 y.o. MRN: [id number]  Chief Complaint  Patient presents with   Diabetes    DIABETES: Ruth Garner presents for the medical management of diabetes.  Medication compliance: Trulicity 4.5mg  weekly  Denies chest pain, shortness of breath, vision changes, polydipsia, polyphagia, polyuria, open wounds/ulcers on feet.  Denies hypoglycemia.  Pertinent lab work: A1C: 5.4 A1c last checked on Oct 2024, with A1c of 8.2%   Lab Results  Component Value Date   HGBA1C 5.4 05/13/2024    No foot exam found No results found for: "LABMICR", "MICROALBUR"  Wt Readings from Last 3 Encounters:  05/13/24 199 lb (90.3 kg)  04/15/24 199 lb (90.3 kg)  11/16/23 245 lb (111.1 kg)   ANXIETY: Ruth Garner presents for concerns about irritability and increase in inattentiveness.  Current medication regimen: none     05/13/2024    3:13 PM 04/15/2024    3:26 PM 05/20/2019    3:37 PM  GAD 7 : Generalized Anxiety Score  Nervous, Anxious, on Edge 3 1 1   Control/stop worrying 3 0 1  Worry too much - different things 3 1 1   Trouble relaxing 3 2 1   Restless 2 1 1   Easily annoyed or irritable 3 3 3   Afraid - awful might happen 1 0 0  Total GAD 7 Score 18 8 8   Anxiety Difficulty Somewhat difficult Somewhat difficult Somewhat difficult      05/13/2024    3:13 PM 04/15/2024    3:25 PM 05/20/2019    3:35 PM  PHQ9 SCORE ONLY  PHQ-9 Total Score 18 15 16    ROS: see HPI     Objective:      BP 102/71 (BP Location: Left Arm, Patient Position: Sitting, Cuff Size: Normal)   Pulse 87   Temp 97.9 F (36.6 C) (Oral)   Resp 18   Ht 5' 8.11" (1.73 m)   Wt 199 lb (90.3 kg)   LMP 04/06/2024 (Exact Date)   SpO2 97%   BMI 30.16 kg/m  BP Readings from Last 3 Encounters:  05/13/24 102/71  04/15/24 109/76  11/16/23 124/83     Physical Exam Vitals reviewed.  Constitutional:       Appearance: Normal appearance.  Cardiovascular:     Rate and Rhythm: Normal rate and regular rhythm.     Pulses: Normal pulses.     Heart sounds: Normal heart sounds.  Pulmonary:     Effort: Pulmonary effort is normal.     Breath sounds: Normal breath sounds.  Neurological:     Mental Status: She is alert.  Psychiatric:        Mood and Affect: Mood normal.        Behavior: Behavior normal.       Assessment & Plan:   1. Diabetes mellitus without complication (HCC) (Primary) A1c at goal. Refills sent to pharmacy. Discussed medication desired effects, potential side effects, and how to administer the medication. Nonpharmaological interventions such as focusing on eating a low carb diet, high in vegetables and fruits discussed. Educated on the importance of physical activity. Discussed signs and symptoms of hypoglycemia and need to present to the ED. Patient verbalizes understanding regarding plan of care and all questions answered.  - POCT glycosylated hemoglobin (Hb A1C) - TRULICITY 4.5 MG/0.5ML SOAJ; Inject 4.5 mg into the skin once a week.  Dispense: 2 mL; Refill: 11  2.  GAD (generalized anxiety disorder) GAD7 completed with score of 18. Denies issues with panic attacks, shortness of breath, difficulty breathing, palpitations, hyperventilation, and dizziness. Reports more issues with irritability and concentration/focus. Discussed benefits of cognitive behavioral therapy (CBT) and first-line pharmacotherapy concurrently. Patient is interested in medication management at this time. Discussed common side effects, including GI side effects, insomnia, lethargy, and decreased libido (usually with higher doses). She is agreeable to trial SSRI medication at this time. Plan for  4-6 week follow-up. Rx sent to pharmacy on file for Lexapro 5mg  daily.  - escitalopram (LEXAPRO) 5 MG tablet; Take 1 tablet (5 mg total) by mouth daily.  Dispense: 30 tablet; Refill: 2  3. MDD (major depressive disorder),  recurrent episode, moderate (HCC) PHQ9 completed with score of 18. Denies active or passive suicidal ideations. She is agreeable to trial SSRI medication at this time. Plan for  4-6 week follow-up. Rx sent to pharmacy on file for Lexapro 5mg  daily.  - escitalopram (LEXAPRO) 5 MG tablet; Take 1 tablet (5 mg total) by mouth daily.  Dispense: 30 tablet; Refill: 2   Return in about 4 weeks (around 06/10/2024) for Physical with fasting labs (mood assessment).    Wilhelmena Hanson, FNP

## 2024-06-07 ENCOUNTER — Ambulatory Visit

## 2024-06-07 DIAGNOSIS — E66811 Obesity, class 1: Secondary | ICD-10-CM

## 2024-06-07 DIAGNOSIS — Z0000 Encounter for general adult medical examination without abnormal findings: Secondary | ICD-10-CM

## 2024-06-07 DIAGNOSIS — E119 Type 2 diabetes mellitus without complications: Secondary | ICD-10-CM

## 2024-06-07 DIAGNOSIS — E039 Hypothyroidism, unspecified: Secondary | ICD-10-CM

## 2024-06-08 LAB — CBC WITH DIFFERENTIAL/PLATELET
Basophils Absolute: 0 10*3/uL (ref 0.0–0.2)
Basos: 0 %
EOS (ABSOLUTE): 0.1 10*3/uL (ref 0.0–0.4)
Eos: 1 %
Hematocrit: 45 % (ref 34.0–46.6)
Hemoglobin: 15 g/dL (ref 11.1–15.9)
Immature Grans (Abs): 0 10*3/uL (ref 0.0–0.1)
Immature Granulocytes: 0 %
Lymphocytes Absolute: 2.7 10*3/uL (ref 0.7–3.1)
Lymphs: 37 %
MCH: 30.5 pg (ref 26.6–33.0)
MCHC: 33.3 g/dL (ref 31.5–35.7)
MCV: 92 fL (ref 79–97)
Monocytes Absolute: 0.4 10*3/uL (ref 0.1–0.9)
Monocytes: 6 %
Neutrophils Absolute: 4.1 10*3/uL (ref 1.4–7.0)
Neutrophils: 56 %
Platelets: 253 10*3/uL (ref 150–450)
RBC: 4.91 x10E6/uL (ref 3.77–5.28)
RDW: 13 % (ref 11.7–15.4)
WBC: 7.4 10*3/uL (ref 3.4–10.8)

## 2024-06-08 LAB — COMPREHENSIVE METABOLIC PANEL WITH GFR
ALT: 13 IU/L (ref 0–32)
AST: 20 IU/L (ref 0–40)
Albumin: 4.6 g/dL (ref 4.0–5.0)
Alkaline Phosphatase: 26 IU/L — ABNORMAL LOW (ref 44–121)
BUN/Creatinine Ratio: 20 (ref 9–23)
BUN: 12 mg/dL (ref 6–20)
Bilirubin Total: 0.7 mg/dL (ref 0.0–1.2)
CO2: 22 mmol/L (ref 20–29)
Calcium: 9.9 mg/dL (ref 8.7–10.2)
Chloride: 101 mmol/L (ref 96–106)
Creatinine, Ser: 0.6 mg/dL (ref 0.57–1.00)
Globulin, Total: 2.3 g/dL (ref 1.5–4.5)
Glucose: 89 mg/dL (ref 70–99)
Potassium: 4.1 mmol/L (ref 3.5–5.2)
Sodium: 136 mmol/L (ref 134–144)
Total Protein: 6.9 g/dL (ref 6.0–8.5)
eGFR: 127 mL/min/{1.73_m2} (ref >59)

## 2024-06-08 LAB — LIPID PANEL
Chol/HDL Ratio: 4 ratio (ref 0.0–4.4)
Cholesterol, Total: 166 mg/dL (ref 100–199)
HDL: 41 mg/dL (ref >39)
LDL Chol Calc (NIH): 107 mg/dL — ABNORMAL HIGH (ref 0–99)
Triglycerides: 99 mg/dL (ref 0–149)
VLDL Cholesterol Cal: 18 mg/dL (ref 5–40)

## 2024-06-08 LAB — HEMOGLOBIN A1C
Est. average glucose Bld gHb Est-mCnc: 117 mg/dL
Hgb A1c MFr Bld: 5.7 % — ABNORMAL HIGH (ref 4.8–5.6)

## 2024-06-08 LAB — TSH RFX ON ABNORMAL TO FREE T4: TSH: 3.11 u[IU]/mL (ref 0.450–4.500)

## 2024-06-09 ENCOUNTER — Ambulatory Visit: Payer: Self-pay | Admitting: Family Medicine

## 2024-06-14 ENCOUNTER — Encounter: Admitting: Family Medicine

## 2024-06-24 ENCOUNTER — Other Ambulatory Visit: Payer: Self-pay | Admitting: Family Medicine

## 2024-06-24 DIAGNOSIS — E119 Type 2 diabetes mellitus without complications: Secondary | ICD-10-CM

## 2024-06-24 MED ORDER — TRULICITY 4.5 MG/0.5ML ~~LOC~~ SOAJ: 4.5000 mg | SUBCUTANEOUS | 11 refills | Status: AC

## 2024-06-24 NOTE — Telephone Encounter (Signed)
 Copied from CRM [id number]. Topic: Clinical - Medication Refill >> Jun 24, 2024 12:05 PM Emylou G wrote: Medication: TRULICITY  4.5 MG/0.5ML SOAJ  Has the patient contacted their pharmacy? Yes (Agent: If no, request that the patient contact the pharmacy for the refill. If patient does not wish to contact the pharmacy document the reason why and proceed with request.) (Agent: If yes, when and what did the pharmacy advise?) said to call us   This is the patient's preferred pharmacy:  [id number] - [place], [place] - [address] [place] [address] Phone: [phone] Fax: [phone]  Is this the correct pharmacy for this prescription? Yes If no, delete pharmacy and type the correct one.   Has the prescription been filled recently? No  Is the patient out of the medication? Yes  Has the patient been seen for an appointment in the last year OR does the patient have an upcoming appointment? Yes  Can we respond through MyChart? No  Agent: Please be advised that Rx refills may take up to 3 business days. We ask that you follow-up with your pharmacy.

## 2024-06-25 ENCOUNTER — Ambulatory Visit: Payer: Self-pay | Admitting: *Deleted

## 2024-06-25 ENCOUNTER — Telehealth: Payer: Self-pay | Admitting: Family Medicine

## 2024-06-25 ENCOUNTER — Other Ambulatory Visit: Payer: Self-pay | Admitting: Family Medicine

## 2024-06-25 DIAGNOSIS — E118 Type 2 diabetes mellitus with unspecified complications: Secondary | ICD-10-CM

## 2024-06-25 MED ORDER — GLIPIZIDE 5 MG PO TABS: 5.0000 mg | ORAL_TABLET | Freq: Two times a day (BID) | ORAL | 0 refills | Status: DC

## 2024-06-25 MED ORDER — GLIPIZIDE 5 MG PO TABS: 5.0000 mg | ORAL_TABLET | Freq: Every day | ORAL | 0 refills | Status: DC

## 2024-06-25 NOTE — Telephone Encounter (Signed)
 Called patient and advised that she should take glipizide  5mg  one tablet a day.  Thisis just to tie her over until she can get Trulicty.

## 2024-06-25 NOTE — Telephone Encounter (Signed)
 I called patient and advised that I think it is best for her to only take one glipizide  5mg  tablet a day.  Stressed that this medication is just to tie her over for the weekend and until she gets her PA for trulicity .

## 2024-06-25 NOTE — Telephone Encounter (Signed)
 FYI Only or Action Required?: FYI only for provider.  Patient was last seen in primary care on 05/13/2024 by [name], FNP.  Called Nurse Triage reporting Hyperglycemia.  Symptoms began yesterday.  Interventions attempted: Nothing.  Symptoms are: gradually worsening.  Triage Disposition: Call PCP Now  Patient/caregiver understands and will follow disposition?: [facility]- patient states she has been requesting all week- call to provider on call- Dr Brice- she is going to call Rx for oral medication in for patient  to get her through the weekend. Patient has been notified - she has been instructed to continue to monitor glucose, take Rx as directed and call office on Monday ot follow up on Rx Reason for Disposition  [1] Caller has URGENT medication or insulin device (e.g., pump, continuous monitoring) question AND [2] triager unable to answer question  Answer Assessment - Initial Assessment Questions 1. BLOOD GLUCOSE: What is your blood glucose level?      388- EMS was called 2. ONSET: When did you check the blood glucose?     Last night 3. USUAL RANGE: What is your glucose level usually? (e.g., usual fasting morning value, usual evening value)     Unsure- patient got hot, dizzy 4. KETONES: Do you check for ketones (urine or blood test strips)? If Yes, ask: What does the test show now?      na 5. TYPE 1 or 2:  Do you know what type of diabetes you have?  (e.g., Type 1, Type 2, Gestational; doesn't know)      Type 2 6. INSULIN: Do you take insulin? What type of insulin(s) do you use? What is the mode of delivery? (syringe, pen; injection or pump)?      Patient takes Trulicity - patient has been trying to get refill- [facility] 7. DIABETES PILLS: Do you take any pills for your diabetes? If Yes, ask: Have you missed taking any pills recently?     no 8. OTHER SYMPTOMS: Do you have any symptoms? (e.g., fever, frequent urination,  difficulty breathing, dizziness, weakness, vomiting)     Dizziness last night  Protocols used: Diabetes - High Blood Sugar-A-AH    Copied from CRM [id number]. Topic: Clinical - Red Word Triage >> Jun 25, 2024  4:01 PM Tobias L wrote: Red Word that prompted transfer to Nurse Triage: sugar levels were at 388 last night, patient awaiting PA for Trulicity 

## 2024-06-28 ENCOUNTER — Telehealth: Payer: Self-pay

## 2024-06-28 NOTE — Telephone Encounter (Signed)
(  KeyBETHA AGAR)  PA Case ID #: [id number]  Rx #: [id number]  Status Sent to Plan today  Drug Trulicity  4.5MG /0.5ML auto-injectors  Form [employer] Electronic Prior Authorization Form Original Claim Info 75 Possible Safety Risk of Drug-Disease. Call [phone]. For a 3 day temporarysupply, submit DUR PPS Level of ServiceCode 03. Member redetermination reqd asof [id number].

## 2024-06-28 NOTE — Telephone Encounter (Signed)
 Outcome  Approved today by [employer] 2017  Approved. TRULICITY  4.5MG /0.5ML Soln Auto-inj is approved from 06/28/2024 to 06/28/2025.  Effective Date: 06/28/2024  Authorization Expiration Date: 06/28/2025

## 2024-06-28 NOTE — Telephone Encounter (Signed)
 PA has been initiated.

## 2024-06-29 ENCOUNTER — Encounter: Payer: Self-pay | Admitting: Family Medicine

## 2024-06-29 ENCOUNTER — Ambulatory Visit (INDEPENDENT_AMBULATORY_CARE_PROVIDER_SITE_OTHER): Admitting: Family Medicine

## 2024-06-29 VITALS — BP 113/78 | HR 79 | Resp 16 | Ht 68.1 in | Wt 193.5 lb

## 2024-06-29 DIAGNOSIS — Z0000 Encounter for general adult medical examination without abnormal findings: Secondary | ICD-10-CM

## 2024-06-29 DIAGNOSIS — E118 Type 2 diabetes mellitus with unspecified complications: Secondary | ICD-10-CM

## 2024-06-29 DIAGNOSIS — E069 Thyroiditis, unspecified: Secondary | ICD-10-CM

## 2024-06-29 DIAGNOSIS — Z124 Encounter for screening for malignant neoplasm of cervix: Secondary | ICD-10-CM | POA: Diagnosis not present

## 2024-06-29 NOTE — Patient Instructions (Signed)
 Health Maintenance Recommendations Screening Testing Mammogram Every 1 -2 years based on history and risk factors Starting at age 26 Pap Smear Ages 21-39 every 3 years Ages 23-65 every 5 years with HPV testing More frequent testing may be required based on results and history Colon Cancer Screening Every 1-10 years based on test performed, risk factors, and history Starting at age 102 Bone Density Screening Every 2-10 years based on history Starting at age 69 for women Recommendations for men differ based on medication usage, history, and risk factors AAA Screening One time ultrasound Men 30-10 years old who have every smoked Lung Cancer Screening Low Dose Lung CT every 12 months Age 20-80 years with a 30 pack-year smoking history who still smoke or who have quit within the last 15 years   Screening Labs Routine  Labs: Complete Blood Count (CBC), Complete Metabolic Panel (CMP), Cholesterol (Lipid Panel) Every 6-12 months based on history and medications May be recommended more frequently based on current conditions or previous results Hemoglobin A1c Lab Every 3-12 months based on history and previous results Starting at age 24 or earlier with diagnosis of diabetes, high cholesterol, BMI >26, and/or risk factors Frequent monitoring for patients with diabetes to ensure blood sugar control Thyroid Panel (TSH w/ T3 & T4) Every 6 months based on history, symptoms, and risk factors May be repeated more often if on medication HIV One time testing for all patients 23 and older May be repeated more frequently for patients with increased risk factors or exposure Hepatitis C One time testing for all patients 47 and older May be repeated more frequently for patients with increased risk factors or exposure Gonorrhea, Chlamydia Every 12 months for all sexually active persons 13-24 years Additional monitoring may be recommended for those who are considered high risk or who have  symptoms PSA Men 72-66 years old with risk factors Additional screening may be recommended from age 2-69 based on risk factors, symptoms, and history   Vaccine Recommendations Tetanus Booster All adults every 10 years Flu Vaccine All patients 6 months and older every year COVID Vaccine All patients 12 years and older Initial dosing with booster May recommend additional booster based on age and health history HPV Vaccine 2 doses all patients age 56-26 Dosing may be considered for patients over 26 Shingles Vaccine (Shingrix) 2 doses all adults 55 years and older Pneumonia (Pneumovax 23) All adults 65 years and older May recommend earlier dosing based on health history Pneumonia (Prevnar 16) All adults 65 years and older Dosed 1 year after Pneumovax 23   Additional Screening, Testing, and Vaccinations may be recommended on an individualized basis based on family history, health history, risk factors, and/or exposure.  __________________________________________________________   Diet Recommendations for All Patients   I recommend that all patients maintain a diet low in saturated fats, carbohydrates, and cholesterol. While this can be challenging at first, it is not impossible and small changes can make big differences.  Things to try: Decreasing the amount of soda, sweet tea, and/or juice to one or less per day and replace with water While water is always the first choice, if you do not like water you may consider adding a water additive without sugar to improve the taste other sugar free drinks Replace potatoes with a brightly colored vegetable at dinner Use healthy oils, such as canola oil or olive oil, instead of butter or hard margarine Limit your bread intake to two pieces or less a day Replace regular pasta with  low carb pasta options Bake, broil, or grill foods instead of frying Monitor portion sizes  Eat smaller, more frequent meals throughout the day instead of large  meals   An important thing to remember is, if you love foods that are not great for your health, you don't have to give them up completely. Instead, allow these foods to be a reward when you have done well. Allowing yourself to still have special treats every once in a while is a nice way to tell yourself thank you for working hard to keep yourself healthy.    Also remember that every day is a new day. If you have a bad day and "fall off the wagon", you can still climb right back up and keep moving along on your journey!   We have resources available to help you!  Some websites that may be helpful include: www.http://www.wall-moore.info/        Www.VeryWellFit.com _____________________________________________________________   Activity Recommendations for All Patients   I recommend that all adults get at least 20 minutes of moderate physical activity that elevates your heart rate at least 5 days out of the week.  Some examples include: Walking or jogging at a pace that allows you to carry on a conversation Cycling (stationary bike or outdoors) Water aerobics Yoga Weight lifting Dancing If physical limitations prevent you from putting stress on your joints, exercise in a pool or seated in a chair are excellent options.   Do determine your MAXIMUM heart rate for activity: YOUR AGE - 220 = MAX HeartRate    Remember! Do not push yourself too hard.  Start slowly and build up your pace, speed, weight, time in exercise, etc.  Allow your body to rest between exercise and get good sleep. You will need more water than normal when you are exerting yourself. Do not wait until you are thirsty to drink. Drink with a purpose of getting in at least 8, 8 ounce glasses of water a day plus more depending on how much you exercise and sweat.      If you begin to develop dizziness, chest pain, abdominal pain, jaw pain, shortness of breath, headache, vision changes, lightheadedness, or other concerning symptoms, stop the  activity and allow your body to rest. If your symptoms are severe, seek emergency evaluation immediately. If your symptoms are concerning, but not severe, please let us know so that we can recommend further evaluation.

## 2024-06-29 NOTE — Progress Notes (Signed)
 Subjective:   Ruth Garner [birth year]  06/29/2024   CC: Chief Complaint  Patient presents with   Annual Exam    HPI: Ruth Garner is a 28 y.o. female who presents for a routine health maintenance exam.  Fasting labs collected previously. Discussed today.    HEALTH SCREENINGS: - Vision Screening: up to date - Dental Visits: plans to schedule - Pap smear: referral placed today  - Breast Exam: deferred today - STD Screening: Declined - Mammogram (40+): Not applicable  - Colonoscopy (45+): Not applicable  - Bone Density (65+ or under 65 with predisposing conditions): Not applicable  - Lung CA screening with low-dose CT:  Not applicable Adults age 64-80 who are current cigarette smokers or quit within the last 15 years. Must have 20 pack year history.   Depression and Anxiety Screen done today and results listed below:     05/13/2024    3:13 PM 04/15/2024    3:25 PM 05/20/2019    3:35 PM 01/27/2018    8:56 AM 01/16/2018   10:46 AM  Depression screen PHQ 2/9  Decreased Interest 2 3 3  0 3  Down, Depressed, Hopeless 2 1 1  0 3  PHQ - 2 Score 4 4 4  0 6  Altered sleeping 3 1 2  0 2  Tired, decreased energy 3 3 3 1 3   Change in appetite 2 2 3  0 3  Feeling bad or failure about yourself  1 0 2 0 3  Trouble concentrating 3 2 1  0 0  Moving slowly or fidgety/restless 2 3 1  0 0  Suicidal thoughts 0 0 0 0 0  PHQ-9 Score 18 15 16 1 17   Difficult doing work/chores Somewhat difficult Somewhat difficult Very difficult Not difficult at all Very difficult      05/13/2024    3:13 PM 04/15/2024    3:26 PM 05/20/2019    3:37 PM  GAD 7 : Generalized Anxiety Score  Nervous, Anxious, on Edge 3 1 1   Control/stop worrying 3 0 1  Worry too much - different things 3 1 1   Trouble relaxing 3 2 1   Restless 2 1 1   Easily annoyed or irritable 3 3 3   Afraid - awful might happen 1 0 0  Total GAD 7 Score 18 8 8   Anxiety Difficulty Somewhat difficult Somewhat difficult Somewhat difficult     IMMUNIZATIONS: - Tdap: Tetanus vaccination status reviewed: last tetanus booster within 10 years. - HPV: Up to date - Influenza: N/A - Pneumovax: Refused - Prevnar 20: Refused - Zostavax (50+): Not applicable  Past medical history, surgical history, medications, allergies, family history and social history reviewed with patient today and changes made to appropriate areas of the chart.   Past Medical History:  Diagnosis Date   Anxiety    Heart murmur     Past Surgical History:  Procedure Laterality Date   CESAREAN SECTION      Current Outpatient Medications on File Prior to Visit  Medication Sig   TRULICITY  4.5 MG/0.5ML SOAJ Inject 4.5 mg into the skin once a week.   No current facility-administered medications on file prior to visit.    Allergies  Allergen Reactions   Metoprolol  Other (See Comments)    Makes me what to kill myself     Social History   Socioeconomic History   Marital status: Single    Spouse name: Not on file   Number of children: Not on file   Years of education: Not on  file   Highest education level: Not on file  Occupational History   Not on file  Tobacco Use   Smoking status: Former    Current packs/day: 0.50    Types: Cigarettes    Passive exposure: Past   Smokeless tobacco: Not on file  Vaping Use   Vaping status: Some Days  Substance and Sexual Activity   Alcohol use: No    Alcohol/week: 0.0 standard drinks of alcohol   Drug use: No   Sexual activity: Yes    Partners: Male    Birth control/protection: Patch  Other Topics Concern   Not on file  Social History Narrative   Not on file   Social Drivers of Health   Financial Resource Strain: Low Risk  (09/03/2022)   [facility] prior to 02/15/2023.   Overall Financial Resource Strain (CARDIA)    Difficulty of Paying Living Expenses: Not very hard  Food Insecurity: No Food Insecurity (09/03/2022)   [facility], [facility] visits prior to 02/15/2023.   Hunger Vital Sign    Worried About Running Out of Food in the Last Year: Never true    Ran Out of Food in the Last Year: Never true  Transportation Needs: No Transportation Needs (09/03/2022)   [facility], [facility] visits prior to 02/15/2023.   PRAPARE - Administrator, Civil Service (Medical): No    Lack of Transportation (Non-Medical): No  Physical Activity: Not on file  Stress: Not on file  Social Connections: Unknown (09/03/2022)   [facility], [facility] visits prior to 02/15/2023.   Social Connection and Isolation Panel    In a typical week, how many times do you talk on the phone with family, friends, or neighbors?: More than three times a week    How often do you get together with friends or relatives?: More than three times a week    Attends Religious Services: Not on file    Active Member of Clubs or Organizations: Not on file    Attends Banker Meetings: Not on file    Marital Status: Not on file  Intimate Partner Violence: Not At Risk (09/03/2022)   [facility] visits prior to 02/15/2023.   Humiliation, Afraid, Rape, and Kick questionnaire    Fear of Current or Ex-Partner: No    Emotionally Abused: No    Physically Abused: No    Sexually Abused: No   Social History   Tobacco Use  Smoking Status Former   Current packs/day: 0.50   Types: Cigarettes   Passive exposure: Past  Smokeless Tobacco Not on file   Social History   Substance and Sexual Activity  Alcohol Use No   Alcohol/week: 0.0 standard drinks of alcohol    Family History  Problem Relation Age of Onset   Diabetes Mother    Hypertension Father      ROS: Denies fever, fatigue, unexplained weight loss/gain, chest pain, SHOB, and palpitations. Denies neurological deficits, gastrointestinal or  genitourinary complaints, and skin changes.   Objective:   Today's Vitals   06/29/24 1037  BP: 113/78  Pulse: 79  Resp: 16  Weight: 193 lb 8 oz (87.8 kg)  Height: 5' 8.1 (1.73 m)  PainSc: 0-No pain    GENERAL APPEARANCE: Well-appearing, in NAD. Well nourished.  SKIN: Pink, warm and dry. Turgor  normal. No rash, lesion, ulceration, or ecchymoses. Hair evenly distributed.  HEENT: HEAD: Normocephalic.  EYES: PERRLA. EOMI. Lids intact w/o defect. Sclera white, Conjunctiva pink w/o exudate.  EARS: External ear w/o redness, swelling, masses or lesions. EAC clear. TM's intact, translucent w/o bulging, appropriate landmarks visualized. Appropriate acuity to conversational tones.  NOSE: Septum midline w/o deformity. Nares patent, mucosa pink and non-inflamed w/o drainage. No sinus tenderness.  THROAT: Uvula midline. Oropharynx clear. Tonsils non-inflamed w/o exudate. Oral mucosa pink and moist.  NECK: Supple, Trachea midline. Full ROM w/o pain or tenderness. No lymphadenopathy. Thyroid  non-tender w/o enlargement or palpable masses.  BREASTS: Deferred.   RESPIRATORY: Chest wall symmetrical w/o masses. Respirations even and non-labored. Breath sounds clear to auscultation bilaterally. No wheezes, rales, rhonchi, or crackles. CARDIAC: S1, S2 present, regular rate and rhythm. No gallops, murmurs, rubs, or clicks. PMI w/o lifts, heaves, or thrills. No carotid bruits. Capillary refill <2 seconds. Peripheral pulses 2+ bilaterally. GI: Abdomen soft w/o distention. Normoactive bowel sounds. No palpable masses or tenderness. No guarding or rebound tenderness. Liver and spleen w/o tenderness or enlargement. No CVA tenderness.  GU: Deferred.  MSK: Muscle tone and strength appropriate for age, w/o atrophy or abnormal movement.  EXTREMITIES: Active ROM intact, w/o tenderness, crepitus, or contracture. No obvious joint deformities or effusions. No clubbing, edema, or cyanosis.  NEUROLOGIC: CN's II-XII intact.  Motor strength symmetrical with no obvious weakness. No sensory deficits. DTR's 2+ symmetric bilaterally. Steady, even gait.  PSYCH/MENTAL STATUS: Alert, oriented x 3. Cooperative, appropriate mood and affect.    Results for orders placed or performed in visit on 06/07/24  TSH Rfx on Abnormal to Free T4   Collection Time: 06/07/24  9:10 AM  Result Value Ref Range   TSH 3.110 0.450 - 4.500 uIU/mL  Lipid panel   Collection Time: 06/07/24  9:10 AM  Result Value Ref Range   Cholesterol, Total 166 100 - 199 mg/dL   Triglycerides 99 0 - 149 mg/dL   HDL 41 >60 mg/dL   VLDL Cholesterol Cal 18 5 - 40 mg/dL   LDL Chol Calc (NIH) 892 (H) 0 - 99 mg/dL   Chol/HDL Ratio 4.0 0.0 - 4.4 ratio  Hemoglobin A1c   Collection Time: 06/07/24  9:10 AM  Result Value Ref Range   Hgb A1c MFr Bld 5.7 (H) 4.8 - 5.6 %   Est. average glucose Bld gHb Est-mCnc 117 mg/dL  Comprehensive metabolic panel with GFR   Collection Time: 06/07/24  9:10 AM  Result Value Ref Range   Glucose 89 70 - 99 mg/dL   BUN 12 6 - 20 mg/dL   Creatinine, Ser 9.39 0.57 - 1.00 mg/dL   eGFR 872 >40 fO/fpw/8.26   BUN/Creatinine Ratio 20 9 - 23   Sodium 136 134 - 144 mmol/L   Potassium 4.1 3.5 - 5.2 mmol/L   Chloride 101 96 - 106 mmol/L   CO2 22 20 - 29 mmol/L   Calcium 9.9 8.7 - 10.2 mg/dL   Total Protein 6.9 6.0 - 8.5 g/dL   Albumin 4.6 4.0 - 5.0 g/dL   Globulin, Total 2.3 1.5 - 4.5 g/dL   Bilirubin Total 0.7 0.0 - 1.2 mg/dL   Alkaline Phosphatase 26 (L) 44 - 121 IU/L   AST 20 0 - 40 IU/L   ALT 13 0 - 32 IU/L  CBC with Differential/Platelet   Collection Time: 06/07/24  9:10 AM  Result Value Ref Range   WBC 7.4 3.4 - 10.8 x10E3/uL  RBC 4.91 3.77 - 5.28 x10E6/uL   Hemoglobin 15.0 11.1 - 15.9 g/dL   Hematocrit 54.9 65.9 - 46.6 %   MCV 92 79 - 97 fL   MCH 30.5 26.6 - 33.0 pg   MCHC 33.3 31.5 - 35.7 g/dL   RDW 86.9 88.2 - 84.5 %   Platelets 253 150 - 450 x10E3/uL   Neutrophils 56 Not Estab. %   Lymphs 37 Not Estab. %    Monocytes 6 Not Estab. %   Eos 1 Not Estab. %   Basos 0 Not Estab. %   Neutrophils Absolute 4.1 1.4 - 7.0 x10E3/uL   Lymphocytes Absolute 2.7 0.7 - 3.1 x10E3/uL   Monocytes Absolute 0.4 0.1 - 0.9 x10E3/uL   EOS (ABSOLUTE) 0.1 0.0 - 0.4 x10E3/uL   Basophils Absolute 0.0 0.0 - 0.2 x10E3/uL   Immature Granulocytes 0 Not Estab. %   Immature Grans (Abs) 0.0 0.0 - 0.1 x10E3/uL    Assessment & Plan:   1. Wellness examination (Primary) - Encouraged a healthy well-balanced diet. Patient may adjust caloric intake to maintain or achieve ideal body weight. May reduce intake of dietary saturated fat and total fat and have adequate dietary potassium and calcium preferably from fresh fruits, vegetables, and low-fat dairy products.   - Advised to avoid cigarette smoking. - Discussed with the patient that most people either abstain from alcohol or drink within safe limits (<=14/week and <=4 drinks/occasion for males, <=7/weeks and <= 3 drinks/occasion for females) and that the risk for alcohol disorders and other health effects rises proportionally with the number of drinks per week and how often a drinker exceeds daily limits. - Discussed cessation/primary prevention of drug use and availability of treatment for abuse.  - Discussed sexually transmitted diseases, avoidance of unintended pregnancy and contraceptive alternatives. - Stressed the importance of regular exercise - Injury prevention: Discussed safety belts, safety helmets, smoke detector, smoking near bedding or upholstery.  - Dental health: Discussed importance of regular tooth brushing, flossing, and dental visits.  NEXT PREVENTATIVE PHYSICAL DUE IN 1 YEAR.  2. Controlled type 2 diabetes mellitus with complication, without long-term current use of insulin (HCC) Will obtain urine microalbumin/creatinine ratio today and update patient with results.  - Urine Microalbumin w/creat. ratio  3. Thyroiditis Patient has a history of autoimmune  lymphocytic chronic thyroiditis. Enlarged thyroid  present during physical exam. Normal thyroid  function in lab work. Will repeat US  thyroid .   - US  THYROID ; Future  4. Screening for cervical cancer Referral placed to OB/GYN.  - Ambulatory referral to Obstetrics / Gynecology   Return in about 6 months (around 12/30/2024) for Diabetes f/u.  Patient to reach out to office if new, worrisome, or unresolved symptoms arise or if no improvement in patient's condition. Patient verbalized understanding and is agreeable to treatment plan. All questions answered to patient's satisfaction.   Evalene Arts, FNP

## 2024-07-01 ENCOUNTER — Encounter (HOSPITAL_BASED_OUTPATIENT_CLINIC_OR_DEPARTMENT_OTHER): Payer: Self-pay

## 2024-07-01 ENCOUNTER — Ambulatory Visit: Payer: Self-pay | Admitting: Family Medicine

## 2024-07-01 LAB — MICROALBUMIN / CREATININE URINE RATIO
Creatinine, Urine: 135.2 mg/dL
Microalb/Creat Ratio: 3 mg/g{creat} (ref 0–29)
Microalbumin, Urine: 3.8 ug/mL

## 2024-08-17 ENCOUNTER — Encounter: Admitting: Obstetrics and Gynecology

## 2024-09-20 NOTE — Progress Notes (Deleted)
 Ruth Small, FNP   No chief complaint on file.   HPI:      Ruth Garner is a 28 y.o. [id number] whose LMP was No LMP recorded., presents today for NP pap smear, referred by PCP    Patient Active Problem List   Diagnosis Date Noted   Acquired hypothyroidism 10/06/2023   Current mild episode of major depressive disorder 05/20/2019   Thyroiditis 04/14/2017   Near syncope 04/14/2017   Tachycardia 04/14/2017   Bronchitis 04/14/2017   Autoimmune lymphocytic chronic thyroiditis 04/14/2017    Past Surgical History:  Procedure Laterality Date   CESAREAN SECTION      Family History  Problem Relation Age of Onset   Diabetes Mother    Hypertension Father     Social History   Socioeconomic History   Marital status: Single    Spouse name: Not on file   Number of children: Not on file   Years of education: Not on file   Highest education level: Not on file  Occupational History   Not on file  Tobacco Use   Smoking status: Former    Current packs/day: 0.50    Types: Cigarettes    Passive exposure: Past   Smokeless tobacco: Not on file  Vaping Use   Vaping status: Some Days  Substance and Sexual Activity   Alcohol use: No    Alcohol/week: 0.0 standard drinks of alcohol   Drug use: No   Sexual activity: Yes    Partners: Male    Birth control/protection: Patch  Other Topics Concern   Not on file  Social History Narrative   Not on file   Social Drivers of Health   Financial Resource Strain: Low Risk  (09/03/2022)   [facility] prior to 02/15/2023.   Overall Financial Resource Strain (CARDIA)    Difficulty of Paying Living Expenses: Not very hard  Food Insecurity: No Food Insecurity (09/03/2022)   [facility] from [facility], [facility] visits prior to 02/15/2023.   Hunger Vital Sign    Within the past 12 months, you worried that your food would run out before you got the  money to buy more.: Never true    Within the past 12 months, the food you bought just didn't last and you didn't have money to get more.: Never true  Transportation Needs: No Transportation Needs (09/03/2022)   [facility], [facility] visits prior to 02/15/2023.   PRAPARE - Administrator, Civil Service (Medical): No    Lack of Transportation (Non-Medical): No  Physical Activity: Not on file  Stress: Not on file  Social Connections: Unknown (09/03/2022)   Received from [facility], [facility] visits prior to 02/15/2023.   Social Connection and Isolation Panel    In a typical week, how many times do you talk on the phone with family, friends, or neighbors?: More than three times a week    How often do you get together with friends or relatives?: More than three times a week    Attends Religious Services: Not on file    Active Member of Clubs or Organizations: Not on file    Attends Banker Meetings: Not on file    Marital Status: Not on file  Intimate Partner Violence: Not At Risk (09/03/2022)   [facility] visits prior to 02/15/2023.  Humiliation, Afraid, Rape, and Kick questionnaire    Within the last year, have you been afraid of your partner or ex-partner?: No    Within the last year, have you been humiliated or emotionally abused in other ways by your partner or ex-partner?: No    Within the last year, have you been kicked, hit, slapped, or otherwise physically hurt by your partner or ex-partner?: No    Within the last year, have you been raped or forced to have any kind of sexual activity by your partner or ex-partner?: No    Outpatient Medications Prior to Visit  Medication Sig Dispense Refill   TRULICITY  4.5 MG/0.5ML SOAJ Inject 4.5 mg into the skin once a week. 2 mL 11   No facility-administered medications prior to visit.      ROS:  Review of  Systems BREAST: No symptoms   OBJECTIVE:   Vitals:  There were no vitals taken for this visit.  Physical Exam  Results: No results found for this or any previous visit (from the past 24 hours).   Assessment/Plan: No diagnosis found.    No orders of the defined types were placed in this encounter.     No follow-ups on file.  Islah Eve B. Ryker Pherigo, PA-C 09/20/2024 12:56 PM

## 2024-09-21 ENCOUNTER — Other Ambulatory Visit (HOSPITAL_COMMUNITY)
Admission: RE | Admit: 2024-09-21 | Discharge: 2024-09-21 | Disposition: A | Discharge status: Home / Self Care | Source: Ambulatory Visit | Attending: Obstetrics and Gynecology | Admitting: Obstetrics and Gynecology

## 2024-09-21 ENCOUNTER — Encounter: Payer: Self-pay | Admitting: Obstetrics and Gynecology

## 2024-09-21 ENCOUNTER — Ambulatory Visit (INDEPENDENT_AMBULATORY_CARE_PROVIDER_SITE_OTHER): Admitting: Obstetrics and Gynecology

## 2024-09-21 ENCOUNTER — Encounter: Admitting: Obstetrics and Gynecology

## 2024-09-21 VITALS — BP 100/68 | HR 93 | Ht 70.0 in | Wt 194.0 lb

## 2024-09-21 DIAGNOSIS — N898 Other specified noninflammatory disorders of vagina: Secondary | ICD-10-CM | POA: Diagnosis not present

## 2024-09-21 DIAGNOSIS — Z124 Encounter for screening for malignant neoplasm of cervix: Secondary | ICD-10-CM | POA: Diagnosis present

## 2024-09-21 DIAGNOSIS — Z803 Family history of malignant neoplasm of breast: Secondary | ICD-10-CM

## 2024-09-21 NOTE — Progress Notes (Signed)
 [name], FNP   Chief Complaint  Patient presents with   Referral    Pap Smear. Lump outside vaginal area that will not go away, not painful x 6 months.    HPI:      [name] is a 28 y.o. [id number] whose LMP was Patient's last menstrual period was 09/05/2024 (approximate)., presents today for NP pap smear, referred by PCP. No hx of abn paps.  Menses are monthly, lasting 2-3 days, mod to heavy flow, no BTB, mild dysmen, no meds taken.  Noticed a lesion on LT labia about 6 months ago. Gets larger and pt tries to pop; usually get purlent/bloody d/c, but then it doesn't fully go away. No pain/itch. Pt shaves.  FH breast cancer in her mom, MGM, MGGM. Pt states mom is gene positive but pt doesn't know which one. Pt hasn't had genetic testing done.   Patient Active Problem List   Diagnosis Date Noted   Acquired hypothyroidism 10/06/2023   Current mild episode of major depressive disorder 05/20/2019   Thyroiditis 04/14/2017   Near syncope 04/14/2017   Tachycardia 04/14/2017   Bronchitis 04/14/2017   Autoimmune lymphocytic chronic thyroiditis 04/14/2017    Past Surgical History:  Procedure Laterality Date   CESAREAN SECTION  2022    Family History  Problem Relation Age of Onset   Breast cancer Mother        44-40   Diabetes Mother    Hypertension Father    Breast cancer Maternal Grandmother        57-45   Breast cancer Maternal Great-grandmother        35-40    Social History   Socioeconomic History   Marital status: Single    Spouse name: Not on file   Number of children: Not on file   Years of education: Not on file   Highest education level: Not on file  Occupational History   Not on file  Tobacco Use   Smoking status: Former    Current packs/day: 0.50    Types: Cigarettes    Passive exposure: Past   Smokeless tobacco: Never  Vaping Use   Vaping status: Every Day  Substance and Sexual Activity   Alcohol use: No    Alcohol/week: 0.0  standard drinks of alcohol   Drug use: No   Sexual activity: Yes    Partners: Male    Birth control/protection: None  Other Topics Concern   Not on file  Social History Narrative   Not on file   Social Drivers of Health   Financial Resource Strain: Low Risk  (09/03/2022)   [facility] visits prior to 02/15/2023.   Overall Financial Resource Strain (CARDIA)    Difficulty of Paying Living Expenses: Not very hard  Food Insecurity: No Food Insecurity (09/03/2022)   Received from [facility], [facility] visits prior to 02/15/2023.   Hunger Vital Sign    Within the past 12 months, you worried that your food would run out before you got the money to buy more.: Never true    Within the past 12 months, the food you bought just didn't last and you didn't have money to get more.: Never true  Transportation Needs: No Transportation Needs (09/03/2022)   Received from [facility], [facility] visits prior to 02/15/2023.   PRAPARE - Administrator, Civil Service (Medical): No    Lack  of Transportation (Non-Medical): No  Physical Activity: Not on file  Stress: Not on file  Social Connections: Unknown (09/03/2022)   Received from [facility], [facility] visits prior to 02/15/2023.   Social Connection and Isolation Panel    In a typical week, how many times do you talk on the phone with family, friends, or neighbors?: More than three times a week    How often do you get together with friends or relatives?: More than three times a week    Attends Religious Services: Not on file    Active Member of Clubs or Organizations: Not on file    Attends Banker Meetings: Not on file    Marital Status: Not on file  Intimate Partner Violence: Not At Risk (09/03/2022)   Received from [facility] visits prior to 02/15/2023.   Humiliation, Afraid, Rape, and  Kick questionnaire    Within the last year, have you been afraid of your partner or ex-partner?: No    Within the last year, have you been humiliated or emotionally abused in other ways by your partner or ex-partner?: No    Within the last year, have you been kicked, hit, slapped, or otherwise physically hurt by your partner or ex-partner?: No    Within the last year, have you been raped or forced to have any kind of sexual activity by your partner or ex-partner?: No    Outpatient Medications Prior to Visit  Medication Sig Dispense Refill   TRULICITY  4.5 MG/0.5ML SOAJ Inject 4.5 mg into the skin once a week. 2 mL 11   No facility-administered medications prior to visit.      ROS:  Review of Systems  Constitutional:  Negative for fever.  Gastrointestinal:  Negative for blood in stool, constipation, diarrhea, nausea and vomiting.  Genitourinary:  Positive for genital sores. Negative for dyspareunia, dysuria, flank pain, frequency, hematuria, urgency, vaginal bleeding, vaginal discharge and vaginal pain.  Musculoskeletal:  Negative for back pain.  Skin:  Negative for rash.   BREAST: No symptoms   OBJECTIVE:   Vitals:  BP 100/68   Pulse 93   Ht 5' 10 (1.778 m)   Wt 194 lb (88 kg)   LMP 09/05/2024 (Approximate)   BMI 27.84 kg/m   Physical Exam Vitals reviewed.  Constitutional:      Appearance: She is well-developed.  Pulmonary:     Effort: Pulmonary effort is normal.  Genitourinary:    General: Normal vulva.     Pubic Area: No rash.      Labia:        Right: No rash, tenderness or lesion.        Left: Lesion present. No rash or tenderness.      Vagina: Normal. No vaginal discharge, erythema or tenderness.     Cervix: Normal.     Uterus: Normal. Not enlarged and not tender.      Adnexa: Right adnexa normal and left adnexa normal.       Right: No mass or tenderness.         Left: No mass or tenderness.     Musculoskeletal:        General: Normal range of motion.      Cervical back: Normal range of motion.  Skin:    General: Skin is warm and dry.  Neurological:     General: No focal deficit present.     Mental Status: She is alert and oriented to person,  place, and time.  Psychiatric:        Mood and Affect: Mood normal.        Behavior: Behavior normal.        Thought Content: Thought content normal.        Judgment: Judgment normal.     Assessment/Plan: Cervical cancer screening - Plan: Cytology - PAP  Vaginal lesion--LT labia; c/w cystic nodule. Reassurance. Warm compresses/leave alone. F/u prn. No evid of abscess. Can also try trimming and not shaving.  Family history of breast cancer--mom with positive gene. Pt to clarify FH and f/u for genetic testing if desires. Pt aware she has 50/50 chance of having same mutation with increased screening/prevention options available.     Return in about 1 year (around 09/21/2025).  Jessilyn Catino B. Jaynia Fendley, PA-C 09/21/2024 1:34 PM

## 2024-09-21 NOTE — Patient Instructions (Signed)
 I value your feedback and you entrusting [place] with your care. If you get a [place] patient survey, I would appreciate you taking the time to let us know about your experience today. Thank you! ? ? ?

## 2024-09-23 LAB — CYTOLOGY - PAP: Diagnosis: NEGATIVE

## 2024-12-30 ENCOUNTER — Ambulatory Visit: Admitting: Family Medicine

## 2024-12-30 ENCOUNTER — Encounter: Payer: Self-pay | Admitting: Family Medicine

## 2024-12-30 VITALS — BP 105/77 | HR 96 | Resp 16 | Ht 70.0 in | Wt 198.0 lb

## 2024-12-30 DIAGNOSIS — Z6828 Body mass index (BMI) 28.0-28.9, adult: Secondary | ICD-10-CM

## 2024-12-30 DIAGNOSIS — E118 Type 2 diabetes mellitus with unspecified complications: Secondary | ICD-10-CM

## 2024-12-30 DIAGNOSIS — F411 Generalized anxiety disorder: Secondary | ICD-10-CM | POA: Diagnosis not present

## 2024-12-30 DIAGNOSIS — F331 Major depressive disorder, recurrent, moderate: Secondary | ICD-10-CM | POA: Diagnosis not present

## 2024-12-30 DIAGNOSIS — E663 Overweight: Secondary | ICD-10-CM

## 2024-12-30 LAB — POCT GLYCOSYLATED HEMOGLOBIN (HGB A1C): Hemoglobin A1C: 5.1 % (ref 4.0–5.6)

## 2024-12-30 MED ORDER — FLUOXETINE HCL 10 MG PO TABS: 10.0000 mg | ORAL_TABLET | Freq: Every day | ORAL | 2 refills | Status: AC

## 2024-12-30 MED ORDER — PHENTERMINE HCL 15 MG PO CAPS: 15.0000 mg | ORAL_CAPSULE | ORAL | 0 refills | Status: AC

## 2024-12-30 NOTE — Progress Notes (Signed)
 "  Established Patient Office Visit  Subjective  Patient ID: Ruth Garner, female    DOB: October 10, 1996  Age: 29 y.o. MRN: [id number]  Chief Complaint  Patient presents with   Diabetes   Discussed the use of AI scribe software for clinical note transcription with the patient, who gave verbal consent to proceed.  History of Present Illness   Ruth Garner is a 29 year old female with type 2 diabetes who presents for a follow-up visit. Denies chest pain, shortness of breath, vision changes, polydipsia, polyphagia, polyuria, open wounds/ulcers on feet. Denies hypoglycemia.   Her last hemoglobin A1c 5.7% was on 06/07/2024; today it was 5.1%. She has been on Trulicity  4.5 mg weekly for at least a year, which is the maximum dose. Her blood sugars have averaged 90 to 100 mg/dL. No side effects from Trulicity , such as chest pain or shortness of breath, but she experiences increased hunger at night, feeling 'starving' if she doesn't eat. During the day, she lacks appetite and often consumes protein shakes or snacks. She was down to 173, and has been fluctuating back up to 200 lbs.   She has a history of gestational diabetes, requiring four insulin shots a day. Her highest recorded A1c at diagnosis was 8.2% in 10/06/2023. She has been on Trulicity  for approximately two to two and a half years, starting after her child's birth.  She is concerned about her weight, having previously reduced to 173 lbs but now back to nearly 200 lbs. She mentions having more energy when taking phentermine  in the past.  She is interested in managing agitation and mood swings, having tried Lexapro , Buspar , and Zoloft , which made her feel like a 'zombie'. She has not tried Prozac .         12/30/2024    4:04 PM 05/13/2024    3:13 PM 04/15/2024    3:26 PM 05/20/2019    3:37 PM  GAD 7 : Generalized Anxiety Score  Nervous, Anxious, on Edge 3 3 1 1   Control/stop worrying 3 3 0 1  Worry too much - different things 3 3 1 1    Trouble relaxing 3 3 2 1   Restless 1 2 1 1   Easily annoyed or irritable 3 3 3 3   Afraid - awful might happen 1 1 0 0  Total GAD 7 Score 17 18 8 8   Anxiety Difficulty Not difficult at all Somewhat difficult Somewhat difficult Somewhat difficult      12/30/2024    4:04 PM 05/13/2024    3:13 PM 04/15/2024    3:25 PM  PHQ9 SCORE ONLY  PHQ-9 Total Score 18 18  15       Data saved with a previous flowsheet row definition    Lab Results  Component Value Date   HGBA1C 5.7 (H) 06/07/2024    No foot exam found Lab Results  Component Value Date   LABMICR 3.8 06/29/2024    Wt Readings from Last 3 Encounters:  12/30/24 198 lb (89.8 kg)  09/21/24 194 lb (88 kg)  06/29/24 193 lb 8 oz (87.8 kg)   ROS: see HPI     Objective:    BP 105/77   Pulse 96   Resp 16   Ht 5' 10 (1.778 m)   Wt 198 lb (89.8 kg)   LMP 12/22/2024   SpO2 98%   BMI 28.41 kg/m  BP Readings from Last 3 Encounters:  12/30/24 105/77  09/21/24 100/68  06/29/24 113/78    Physical Exam  Vitals reviewed.  Constitutional:      Appearance: Normal appearance.  Cardiovascular:     Rate and Rhythm: Normal rate and regular rhythm.     Pulses: Normal pulses.     Heart sounds: Normal heart sounds.  Pulmonary:     Effort: Pulmonary effort is normal.     Breath sounds: Normal breath sounds.  Neurological:     Mental Status: She is alert.  Psychiatric:        Mood and Affect: Mood normal.        Behavior: Behavior normal.     Assessment & Plan:   1. Controlled type 2 diabetes mellitus with complication, without long-term current use of insulin (HCC) (Primary) A1c at goal; however, patient has noticed weight gain with current medication regimen. Significant improvement in glycemic control with A1c reduced to 5.1%. Potential for remission if A1c remains below prediabetic range for a year. Continue Trulicity  4.5 mg weekly. Encouraged lifestyle modifications including diet and exercise.  Educated on the importance of  physical activity. Weight increased to nearly 200 lbs. Previous successful weight loss with phentermine  discussed. PDMP reviewed, no red flags present. Prescribed phentermine  for weight loss. Follow up in 4 weeks for management on phentermine . Patient verbalizes understanding regarding plan of care and all questions answered.  - POCT HgB A1C - phentermine  15 MG capsule; Take 1 capsule (15 mg total) by mouth every morning.  Dispense: 30 capsule; Refill: 0  2. Overweight with body mass index (BMI) of 28 to 28.9 in adult See #1  3. Moderate episode of recurrent major depressive disorder (HCC) PHQ9 completed with score of 18. Denies active or passive suicidal ideations. Previous medications caused adverse effects. Discussed trial of Prozac  for mood stabilization. Prescribed Prozac  10 mg daily. Scheduled follow-up in 4-6 weeks to assess efficacy and adjust dosage if necessary. - FLUoxetine  (PROZAC ) 10 MG tablet; Take 1 tablet (10 mg total) by mouth daily.  Dispense: 30 tablet; Refill: 2  4. GAD (generalized anxiety disorder) GAD7 completed with score of 17. Denies issues with panic attacks, shortness of breath, difficulty breathing, palpitations, hyperventilation, and dizziness. Discussed benefits of cognitive behavioral therapy (CBT) and first-line pharmacotherapy concurrently. Patient is interested in trialing Prozac . Discussed common side effects, including GI side effects, insomnia, lethargy, and decreased libido (usually with higher doses). Plan for  4-6 week follow-up. Rx sent to pharmacy on file.  - FLUoxetine  (PROZAC ) 10 MG tablet; Take 1 tablet (10 mg total) by mouth daily.  Dispense: 30 tablet; Refill: 2    Return in about 4 weeks (around 01/27/2025) for Mood f/u.   Evalene Arts, FNP "

## 2024-12-30 NOTE — Patient Instructions (Signed)

## 2025-02-01 ENCOUNTER — Ambulatory Visit: Admitting: Family Medicine
